# Patient Record
Sex: Female | Born: 1975
Health system: Southern US, Community
[De-identification: ages and names within clinical notes are randomized; demographics above are authoritative.]

## PROBLEM LIST (undated history)

## (undated) DIAGNOSIS — J302 Other seasonal allergic rhinitis: Secondary | ICD-10-CM

## (undated) DIAGNOSIS — Z950 Presence of cardiac pacemaker: Secondary | ICD-10-CM

## (undated) DIAGNOSIS — I10 Essential (primary) hypertension: Secondary | ICD-10-CM

## (undated) DIAGNOSIS — I499 Cardiac arrhythmia, unspecified: Secondary | ICD-10-CM

## (undated) DIAGNOSIS — F419 Anxiety disorder, unspecified: Secondary | ICD-10-CM

## (undated) HISTORY — PX: TUBAL LIGATION: SHX77

## (undated) HISTORY — PX: INSERT / REPLACE / REMOVE PACEMAKER: SUR710

## (undated) HISTORY — PX: CHOLECYSTECTOMY: SHX55

## (undated) HISTORY — PX: BREAST SURGERY: SHX581

## (undated) HISTORY — DX: Cardiac arrhythmia, unspecified: I49.9

## (undated) HISTORY — PX: ABDOMINOPLASTY: SUR9

## (undated) HISTORY — DX: Essential (primary) hypertension: I10

---
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
## 1998-03-15 ENCOUNTER — Encounter (HOSPITAL_COMMUNITY): Admission: RE | Admit: 1998-03-15 | Discharge: 1998-03-17 | Payer: Self-pay | Admitting: Gynecology

## 1998-03-17 ENCOUNTER — Inpatient Hospital Stay (HOSPITAL_COMMUNITY): Admission: AD | Admit: 1998-03-17 | Discharge: 1998-03-20 | Payer: Self-pay | Admitting: Gynecology

## 1998-04-26 ENCOUNTER — Other Ambulatory Visit: Admission: RE | Admit: 1998-04-26 | Discharge: 1998-04-26 | Payer: Self-pay | Admitting: Obstetrics and Gynecology

## 1998-06-15 ENCOUNTER — Ambulatory Visit (HOSPITAL_COMMUNITY): Admission: RE | Admit: 1998-06-15 | Discharge: 1998-06-16 | Payer: Self-pay | Admitting: Cardiovascular Disease

## 1998-07-10 ENCOUNTER — Ambulatory Visit (HOSPITAL_COMMUNITY): Admission: RE | Admit: 1998-07-10 | Discharge: 1998-07-11 | Payer: Self-pay | Admitting: General Surgery

## 1999-10-08 ENCOUNTER — Other Ambulatory Visit: Admission: RE | Admit: 1999-10-08 | Discharge: 1999-10-08 | Payer: Self-pay | Admitting: Gynecology

## 2000-11-06 ENCOUNTER — Inpatient Hospital Stay (HOSPITAL_COMMUNITY): Admission: AD | Admit: 2000-11-06 | Discharge: 2000-11-06 | Payer: Self-pay | Admitting: Gynecology

## 2001-02-16 ENCOUNTER — Other Ambulatory Visit: Admission: RE | Admit: 2001-02-16 | Discharge: 2001-02-16 | Payer: Self-pay | Admitting: Gynecology

## 2001-09-03 ENCOUNTER — Inpatient Hospital Stay (HOSPITAL_COMMUNITY): Admission: AD | Admit: 2001-09-03 | Discharge: 2001-09-05 | Payer: Self-pay | Admitting: Gynecology

## 2001-10-15 ENCOUNTER — Other Ambulatory Visit: Admission: RE | Admit: 2001-10-15 | Discharge: 2001-10-15 | Payer: Self-pay | Admitting: Gynecology

## 2002-12-17 ENCOUNTER — Other Ambulatory Visit: Admission: RE | Admit: 2002-12-17 | Discharge: 2002-12-17 | Payer: Self-pay | Admitting: Gynecology

## 2004-09-19 ENCOUNTER — Other Ambulatory Visit: Admission: RE | Admit: 2004-09-19 | Discharge: 2004-09-19 | Payer: Self-pay | Admitting: Gynecology

## 2005-04-03 ENCOUNTER — Inpatient Hospital Stay (HOSPITAL_COMMUNITY): Admission: AD | Admit: 2005-04-03 | Discharge: 2005-04-07 | Payer: Self-pay | Admitting: Gynecology

## 2005-04-03 ENCOUNTER — Encounter (INDEPENDENT_AMBULATORY_CARE_PROVIDER_SITE_OTHER): Payer: Self-pay | Admitting: Specialist

## 2005-06-19 ENCOUNTER — Other Ambulatory Visit: Admission: RE | Admit: 2005-06-19 | Discharge: 2005-06-19 | Payer: Self-pay | Admitting: Gynecology

## 2006-07-31 ENCOUNTER — Other Ambulatory Visit: Admission: RE | Admit: 2006-07-31 | Discharge: 2006-07-31 | Payer: Self-pay | Admitting: Gynecology

## 2006-08-30 HISTORY — PX: DILATION AND CURETTAGE OF UTERUS: SHX78

## 2006-09-22 ENCOUNTER — Encounter (INDEPENDENT_AMBULATORY_CARE_PROVIDER_SITE_OTHER): Payer: Self-pay | Admitting: *Deleted

## 2006-09-22 ENCOUNTER — Ambulatory Visit (HOSPITAL_COMMUNITY): Admission: RE | Admit: 2006-09-22 | Discharge: 2006-09-22 | Payer: Self-pay | Admitting: Gynecology

## 2008-08-25 ENCOUNTER — Ambulatory Visit (HOSPITAL_COMMUNITY): Admission: RE | Admit: 2008-08-25 | Discharge: 2008-08-25 | Payer: Self-pay | Admitting: *Deleted

## 2010-08-15 ENCOUNTER — Ambulatory Visit: Payer: Self-pay | Admitting: Cardiology

## 2010-08-27 ENCOUNTER — Ambulatory Visit: Payer: Self-pay | Admitting: Cardiology

## 2010-09-05 ENCOUNTER — Ambulatory Visit: Payer: Self-pay | Admitting: Cardiology

## 2010-11-03 ENCOUNTER — Encounter: Payer: Self-pay | Admitting: Internal Medicine

## 2010-11-27 ENCOUNTER — Encounter (INDEPENDENT_AMBULATORY_CARE_PROVIDER_SITE_OTHER): Payer: Self-pay | Admitting: *Deleted

## 2010-12-25 ENCOUNTER — Encounter (INDEPENDENT_AMBULATORY_CARE_PROVIDER_SITE_OTHER): Payer: Self-pay | Admitting: *Deleted

## 2011-01-07 ENCOUNTER — Encounter: Payer: Self-pay | Admitting: Internal Medicine

## 2011-01-31 NOTE — Miscellaneous (Signed)
Summary: Device preload  Clinical Lists Changes  Observations: Added new observation of PPM INDICATN: CHB (11/03/2010 12:31) Added new observation of MAGNET RTE: BOL 85 ERI 65 (11/03/2010 12:31) Added new observation of PPMLEADSTAT2: active (11/03/2010 12:31) Added new observation of PPMLEADSER2: [id number] (11/03/2010 12:31) Added new observation of PPMLEADMOD2: 1236T (11/03/2010 12:31) Added new observation of PPMLEADDOI2: 06/15/1998 (11/03/2010 12:31) Added new observation of PPMLEADLOC2: RV (11/03/2010 12:31) Added new observation of PPMLEADSTAT1: active (11/03/2010 12:31) Added new observation of PPMLEADSER1: [id number] (11/03/2010 12:31) Added new observation of PPMLEADMOD1: 1342T (11/03/2010 12:31) Added new observation of PPMLEADDOI1: 06/15/1998 (11/03/2010 12:31) Added new observation of PPMLEADLOC1: RA (11/03/2010 12:31) Added new observation of PPM DOI: 08/25/2008 (11/03/2010 12:31) Added new observation of PPM SERL#: [id number] (11/03/2010 12:31) Added new observation of PPM MODL#: [id number] (11/03/2010 12:31) Added new observation of PACEMAKERMFG: Medtronic (11/03/2010 12:31) Added new observation of PPM IMP [name]: [name] (11/03/2010 12:31) Added new observation of PPM REFER [name]: [name] (11/03/2010 12:31) Added new observation of PACEMAKER [name]: Alejandra Bunting, [name] (11/03/2010 12:31)      PPM Specifications Following [name]:  Alejandra Bunting, [name]     Referring [name]:  [name] PPM Vendor:  Medtronic     PPM Model Number:  [id number]     PPM Serial Number:  [id number] PPM DOI:  08/25/2008     PPM Implanting [name]:  [name]  Lead 1    Location: RA     DOI: 06/15/1998     Model #: [id number]     Serial #: [id number]     Status: active Lead 2    Location: RV     DOI: 06/15/1998     Model #: 1236T     Serial #: [id number]     Status: active  Magnet Response Rate:  BOL 85 ERI 65  Indications:  CHB

## 2011-01-31 NOTE — Letter (Signed)
Summary: Appointment - Missed  [facility], Main Office  [address]   [place], [place] [address]   Phone: [phone]  Fax: [phone]     November 27, 2010 MRN: [id number]   [facility] [address] [place], [place]  [address]   Dear [name],  Our records indicate you missed your appointment on 11-19-10 with the [facility].  It is very important that we reach you to reschedule this appointment. We look forward to participating in your health care needs. Please contact us at the number listed above at your earliest convenience to reschedule this appointment.     Sincerely,    Glass blower/designer

## 2011-01-31 NOTE — Cardiovascular Report (Signed)
Summary: Certified Letter Signed - Patient (No Response, No Appt)  Certified Letter Signed - Patient (No Response, No Appt)   Imported By: Debby Freiberg 01/17/2011 11:13:16  _____________________________________________________________________  External Attachment:    Type:   Image     Comment:   External Document

## 2011-01-31 NOTE — Letter (Signed)
Summary: Device-Delinquent Check  [facility], Main Office  [address]   [place], [place] [address]   Phone: [phone]  Fax: [phone]     December 25, 2010 MRN: [id number]   [facility] [address] [place], [place]  [address]   Dear [name],  According to our records, you have not had your implanted device checked in the recommended period of time.  We are unable to determine appropriate device function without checking your device on a regular basis.  Please call our office to schedule an appointment as soon as possible.  If you are having your device checked by another physician, please call us so that we may update our records.  Thank you,  Altha Harm, LPN  December 25, 2010 4:28 PM  [facility]  certified mail

## 2011-03-06 ENCOUNTER — Ambulatory Visit: Payer: Self-pay | Admitting: Nurse Practitioner

## 2011-05-02 ENCOUNTER — Encounter: Payer: Self-pay | Admitting: *Deleted

## 2011-05-17 NOTE — H&P (Signed)
[name], [name]               ACCOUNT NO.:  [id number]   MEDICAL RECORD NO.:  [id number]          PATIENT TYPE:  MAT   LOCATION:  MATC                          [facility]   PHYSICIAN:  Timothy P. Fontaine, M.D.DATE OF BIRTH:  [birth year]   DATE OF ADMISSION:  DATE OF DISCHARGE:                                HISTORY & PHYSICAL   DATE OF ADMISSION:  April 03, 2005   CHIEF COMPLAINT:  Pregnancy at 39 weeks, favorable cervix, pacemaker.   HISTORY OF PRESENT ILLNESS:  [name] at 38 weeks with  favorable cervix for induction. The patient's pregnancy has been  uncomplicated. 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 Franchot Heidelberg. 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 For the  remainder of her history, see [name].   PHYSICAL EXAMINATION:  HEENT:  Normal.  LUNGS:  Clear.  CARDIAC:  Alejandra 2/6 systolic ejection murmur felt to be functional by Dr.  Alanda Amass.  ABDOMEN:  Benign, uterus gravid, consistent with term. Positive fetal heart  tone.  PELVIC:  Cervix 50% effaced, 2-Johnston cm dilated, -2 station.   ASSESSMENT AND PLAN:  [name], favorable cervix at 39  weeks, history of conduction defect with pacemaker, for induction. She is  beta strep positive and will plan on antibiotic prophylaxis with initiation  of her Pitocin induction. The plan had been discussed with the patient who  understands and accepts.      TPF/MEDQ  D:  04/02/2005  T:  04/02/2005  Job:  [id number]

## 2011-05-17 NOTE — Op Note (Signed)
[name], [name]               ACCOUNT NO.:  [id number]   MEDICAL RECORD NO.:  [id number]          PATIENT TYPE:  INP   LOCATION:  9168                          [facility]   PHYSICIAN:  Ivor Costa. Farrel Gobble, M.D. DATE OF BIRTH:  01-26-1976   DATE OF PROCEDURE:  04/03/2005  DATE OF DISCHARGE:                                 OPERATIVE REPORT   PREOPERATIVE DIAGNOSIS:  1.  39 week intrauterine pregnancy.  2.  Attempted vaginal birth after cesarean section.  3.  Fetal bradycardia.   POSTOPERATIVE DIAGNOSIS:  1.  39 week intrauterine pregnancy.  2.  Attempted vaginal birth after cesarean section.  3.  Fetal bradycardia.  4.  Complete uterine dehiscence.  5.  Undesired fertility.   PROCEDURE:  STAT repeat cesarean section and a modified bilateral tubal  ligation.   SURGEON:  Ivor Costa. Farrel Gobble, M.D.   ANESTHESIA:  Epidural.   FLUIDS REPLACED:  2 liters lactated ringers.   ESTIMATED BLOOD LOSS:  400 mL.   URINE OUTPUT:  150 mL clear urine.   INDICATIONS FOR PROCEDURE:  After seeing patient 20 minutes before for IUPC  placement status post epidural, she was noted to be 4 cm, came back to the  room prior to leaving the hospital just to check on the patient and found  the patient to be in a deep semi-Fowler's position with bradycardia with the  nurse attempting to get fetal heart tones.  As an FSE had not been placed at  this point, we attempted to place an FSE over the cervix, which was deviated  posteriorly felt to be secondary to the patient's exaggerated position as  the vertex had not been engaged.  After two attempts, we rotated the patient  to her back.  However, the cervix remained posterior and although an FSE was  placed, it was felt that the elevation of the fetal vertex was due to a  possible uterine dehiscence and the patient was transferred STAT to the OR.  Fetal bradycardia in 70's and poor attenuation of IUPC were noted.   FINDINGS:  Complete uterine  dehiscence of fetus and placenta in the  abdominal cavity, birth weight not available, Apgars were 2 at 1 minute, 5  at 5 minutes, and 10 minutes was not available.  Except for the dehiscence,  the uterus, tubes, and ovaries were unremarkable.   COMPLICATIONS:  As above.   PATHOLOGY:  Mid portion of the left and right tube as well as the placenta.   DESCRIPTION OF PROCEDURE:  Without scrubbing, I gowned and gloved, quickly  put Betadine on the patient, placed a Foley catheter, and after anesthesia  was assured, a Pfannenstiel skin incision was made with the scalpel.  The  fascia was scored and extended laterally with the scalpel.  The inferior  aspect of the incision was grasped with Kochers and the underlying rectus  muscles were dissected sharply.  In a similar fashion, the superior aspect  of the incision was grasped with Kochers and the underlying rectus muscles  were sharply dissected off.  The rectus muscles were naturally separated  in  the midline.  The peritoneum was entered bluntly and extended bluntly.  At  this point, blood in the abdomen was noted.  The fetal ear was also seen,  however, the head was not engaged and was unable to be grasped because of  the fluid.  Ultimately, the baby was delivered with the aid of baby  Elliott's from the abdominal cavity.  A slight spontaneous cry was noted.  He was handed off to the awaiting pediatricians.  The cord was noted to be  markedly thin with scant amount of Wharton's jelly.  The placenta was also  in the cavity which made it unable for [place] to obtain cord bloods.  An attempt  at a pH was performed which came back at 6.92 with a base excess of -16.  The infant was handed to the pediatricians who continued in the  resuscitation of the baby.   The uterus was exteriorized, the scar was noted.  It had not been extended  for delivery of the baby, it was noted to be non-bleeding.  The inferior  aspect of the incision was grasped with a  ring and elevated and the bladder  was noted to be inferior to this point, as well.  The uterine incision was  then repaired with a running locked layer of 0 chromic and a second suture  of chromic was used to imbricate.  It was noted to be hemostatic afterwards.  As the patient had conceived status post a vasectomy she consented to a  tubal  ligation in the event of a C-section.  The tubes and ovaries were  inspected and noted to be unremarkable.  The mid portion of the right tube  was elevated, two free ties of 3-0 plain were placed and the mid portion was  excised.  In a similar fashion, the left tube was treated.  They were both  noted to be hemostatic.  The pelvis was irrigated.  The uterus was returned  back to the abdomen.  It was noted to be hemostatic.  The abdomen was  cleared of all clots and debris.  I inspected the omentum as well as the  bowel and they were noted to be unremarkable.  The inspection of the  bladder, fascia, peritoneum, and muscle also were noted to be unremarkable  and hemostatic.  The fascial incision was closed with a running locked layer  of 0 vircyl and noted to be hemostatic, the subcu was irrigated, and the  skin was closed with staples.  The patient received penicillin  preoperatively and gentamicin interoperatively because of placement of a  pacemaker.      THL/MEDQ  D:  04/03/2005  T:  04/03/2005  Job:  [id number]

## 2011-05-17 NOTE — Discharge Summary (Signed)
[facility]  Patient:    [name], [name] Visit Number: [id number] MRN: [id number]          Service Type: OBS Location: 910A 9117 01 Attending Physician:  Merrily Pew Dictated by:   Antony Contras, N.P. Admit Date:  09/03/2001 Discharge Date: 09/05/2001                             Discharge Summary  DISCHARGE DIAGNOSES:          Intrauterine pregnancy at 38 weeks with spontaneous onset of labor.  HISTORY OF PRESENT ILLNESS:   Patient is a 35 year old gravida 5, para 2-0-2-2 with an LMP December 08, 2000, [facility] September 14, 2001.  Prenatal risk factors include a history of previous cesarean section, heart block, recurrent UTI, and also patient is Rh negative.  PRENATAL LABORATORIES:        Blood type O-.  Antibody screen negative.  RPR, HBSAG, HIV nonreactive.  Rubella immune.  GBS positive.  HOSPITAL COURSE:              Patient was admitted on September 03, 2001 with spontaneous onset of labor.  Cervix was 2 cm, 50%, -3 station.  Patient was also started on penicillin G for positive GBS.  She progressed to complete dilatation and delivered an Apgar 9/9 female infant over an intact perineum. Delivery was precipitous.  The infant weighed 6 pounds 12 ounces.  Postpartum course patient remained afebrile.  Was able to be discharged on her second postpartum day in satisfactory condition.  LABORATORIES:                 CBC:  Hematocrit 31.1, hemoglobin 11.1, WBC 9.8, platelets 152,000.  Baby was Rh positive so she did receive RhoGAM.  DISPOSITION:                  Follow-up in six weeks.  Continue prenatal vitamins and iron, Motrin and Tylox for pain. Dictated by:   Antony Contras, N.P. Attending Physician:  Merrily Pew DD:  09/25/01 TD:  09/25/01 Job: [id number]

## 2011-05-17 NOTE — Discharge Summary (Signed)
NAMEARNETT, GALINDEZ               ACCOUNT NO.:  [id number]   MEDICAL RECORD NO.:  [id number]          PATIENT TYPE:  INP   LOCATION:  9315                          [facility]   PHYSICIAN:  Juan H. Lily Peer, M.D.DATE OF BIRTH:  05/18/1976   DATE OF ADMISSION:  04/03/2005  DATE OF DISCHARGE:  04/07/2005                                 DISCHARGE SUMMARY   HISTORY:  The patient is a 35 year old gravida 5, para 3, now para 4 who was  admitted on April 5 with a favorable cervix for induction.  The patient with  history, first pregnancy resulting in a vaginal delivery, second pregnancy  cesarean section secondary to breech, and third pregnancy successful vaginal  birth after C-section.  The patient wished for another trial of labor,  developed fetal bradycardia, was taken to the operating room where there was  evidence of complete uterine descensus and the patient also had requested as  well tubal ligation prenatally for which she had been counseled.  The  patient underwent an emergency repeat lower uterine segment transverse  cesarean section with modified bilateral tubal sterilization procedure by  Dr. Douglass Rivers on April 03, 2005, the patient delivered a viable female  infant, Apgars of 5 and 5.  The patient postoperatively did well, had Foley  catheter for 24 hours which was then removed and the patient was advanced  from a clear liquid to a full regular diet.  Her  postoperative hemoglobin  and hematocrit had been 8.1 and 27.0 respectively.  The platelet count was  347,000.  The patient also did receive RhoGAM due to the fact that the baby  was Rh positive and the patient was ready to be discharged home on the  fourth postoperative day.   FINAL DIAGNOSES:  1.  39 weeks' estimated gestational age, delivered via cesarean section for      fetal bradycardia.  Findings intraoperatively of uterine dehiscence.      Delivered a viable female infant.  2.  The patient requested repeat  cesarean section.  3.  RhoGAM candidate.  4.  Postpartum anemia.  5.  The patient was requesting elective permanent sterilization.   PROCEDURE:  1.  Repeat lower uterine segment transverse cesarean section.  2.  Bilateral tubal sterilization procedure.  3.  RhoGAM administration.   FINAL DISPOSITION:  The patient was discharged home on her fourth  postoperative day.  Her staples were removed.  Her incision was intact.  She  was passing flatus, was ambulating, showering, tolerating a regular diet  well, and was ready to be discharged home.  She was given a prescription for  prenatal vitamins to take one p.o. daily.  Also iron supplementation one  p.o. daily and a prescription for Lortab 7.5/500 to take one p.o. q.4-6h.  p.r.n. pain.  GGA discharge instruction sheet was provided and the patient  to follow up for 6-week postpartum visit.     JHF/MEDQ  D:  04/07/2005  T:  04/07/2005  Job:  [id number]

## 2011-05-17 NOTE — Op Note (Signed)
[name], [name]               ACCOUNT NO.:  [id number]   MEDICAL RECORD NO.:  [id number]          PATIENT TYPE:  AMB   LOCATION:  SDC                           [facility]   PHYSICIAN:  Ivor Costa. Farrel Gobble, M.D. DATE OF BIRTH:  31-Jan-1976   DATE OF PROCEDURE:  09/22/2006  DATE OF DISCHARGE:                                 OPERATIVE REPORT   PREOPERATIVE DIAGNOSIS:  Menorrhagia.   POSTOPERATIVE DIAGNOSIS:  Menorrhagia.   PROCEDURE:  D&C hysteroscopy with NovaSure endometrial ablation.   SURGEON:  Ivor Costa. Farrel Gobble, M.D.   ANESTHESIA:  MAC with a paracervical block of 10 mL of quarter percent  Marcaine.   I&O deficit of lactated Ringer's was 40 mL.   ESTIMATED BLOOD LOSS:  Minimal.   FINDINGS:  The uterus was anteverted with normal smooth contours.  Ostia  were visualized.  The uterus sounded to 9.  Cervix was 3 cm, and the width  was 3.5.   COMPLICATIONS:  None.   PATHOLOGY:  Endometrial curettings.   PROCEDURE:  The patient was taken to the operating room.  General anesthesia  was induced, placed in the dorsal lithotomy position, prepped and draped in  usual sterile fashion.  A bimanual exam was performed.  Bivalve speculum was  placed in vagina.  Cervix was visualized, stabilized with a single-tooth  tenaculum and paracervical block was placed.  The cervical length was then  measured with a #7 and #8 Hagar dilator.  The orientation of the canal was  confirmed, and the length was also confirmed.  The cervix required dilation  to 21-French.  The diagnostic hysteroscope was then advanced through the  cervix towards the fundus.  The ostia were visualized.  The cavity was  smooth.  Endometrial curetting was performed, and tissues were then sent for  pathology.  The cervix required dilation up to 21-French in order to allow  passage of the NovaSure which was set at the indications above.  The seeding  was done with the usual maneuvers.  The cavity width was confirmed at  3.5.  The cervix was grasped anterior and posterior.  Because of the slight  dilation, a puff test of CO2 was passed.  The ablation then occurred with  power of 116 at a minute and 50 seconds.  The NovaSure was removed.  The  hysteroscope was then advanced  back through the cavity.  A good ablative technique was seen, and the  treated cervical portion was also seen clearly.  The patient tolerated the  procedure well.  Sponge, lap and needle counts were correct.  She was  transferred to the PACU in stable condition.      Ivor Costa. Farrel Gobble, M.D.  Electronically Signed     THL/MEDQ  D:  09/22/2006  T:  09/24/2006  Job:  [id number]

## 2011-05-17 NOTE — H&P (Signed)
[facility]  Patient:    [name], [name] Visit Number: [id number] MRN: [id number]          Service Type: OBS Location: 910B 9156 01 Attending Physician:  Merrily Pew Dictated by:   Nadyne Coombes Fontaine, M.D. Admit Date:  09/03/2001                           History and Physical  CHIEF COMPLAINT:              1. Labor.                               2. Pregnancy at term.  HISTORY OF PRESENT ILLNESS:   This is a 35 year old, G5, P2, Ab2, female at [redacted] weeks gestation who enters with regular uterine contractions every three minutes. The patient had episodes of nausea and vomiting in the evening and subsequently developed contractions. The patient received IV hydration in triage without resolution of her contractions, in fact, noting that they are getting stronger, and at the time of admission, she is contracting every three to five minutes. No history of rupture of membranes or bloody show.  PRENATAL COURSE:              Significant for history of prior cesarean section for breech presentation who desires VBAC after appropriate counseling. She also has a history of heart block for which she has a pacemaker with no reported recent problems. The patient is Rh negative and is strep positive on screening.  PAST MEDICAL HISTORY:         Significant for her heart block with pacemaker.  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  MEDICATIONS:                  None.  ALLERGIES:                    None.  REVIEW OF SYSTEMS:            Noncontributory.  SOCIAL HISTORY:               Noncontributory.  ADMISSION PHYSICAL EXAMINATION:  VITAL SIGNS:                  Afebrile. Vital signs are stable.  HEENT:                        Normal.  LUNGS:                        Clear.  CARDIAC:                      Regular rate. No murmurs, rubs, or  gallops.  ABDOMEN:                      Gravid, vertex.  ELECTRONIC FETAL MONITORING:  External monitor shows reactive fetus and contractions every three to five minutes.  PELVIC:                       2 cm, 50%, -3 station, vertex presentation.  ASSESSMENT:  1. Term pregnancy.                               2. Early labor for planned vaginal birth after                                  cesarean section.                               3. Streptococcus positive.  PLAN:                         Will plan antibiotic prophylaxis. Admission and artificial rupture of membranes. Plan was discussed with patient and her husband who understand and accept. Dictated by:   Nadyne Coombes. Fontaine, M.D. Attending Physician:  Merrily Pew DD:  09/03/01 TD:  09/03/01 Job: [id number]

## 2011-12-03 ENCOUNTER — Other Ambulatory Visit: Payer: Self-pay | Admitting: Cardiology

## 2014-04-25 NOTE — H&P (Addendum)
[name] is an 38 y.o. femalewith menorrhagia presents for surgical mngt.  Continues to have dysmenorrhea and menorrhagia despite endometrial ablation  Pertinent Gynecological History: Menses: heavy with cramping Bleeding: dysfunctional uterine bleeding Contraception: tubal ligation DES exposure: denies Blood transfusions: none Sexually transmitted diseases: no past history Previous GYN Procedures: none  Last pap: normal Date: 7/14 OB History: G5, P4   Menstrual History: No LMP recorded. 04/04/14    Past Medical History  Diagnosis Date  . Hypertension   . Arrhythmia     complete heart block    Past Surgical History  Procedure Laterality Date  . Cardiac catheterization    . Insert / replace / remove pacemaker  2009  c-section x 2 SVD x 2 Breast augmentation Cholecystectomy Endometrial ablation  Family History  Problem Relation Age of Onset  . Hypertension Mother     Social History:  reports that she has never smoked. She does not have any smokeless tobacco history on file. Her alcohol and drug histories are not on file.  Allergies: No Known Allergies  No prescriptions prior to admission   Meds:  Diovan, celexa ROS  Physical Exam AF, VSS  BP 112/74 Gen - NAD CV - RRR Lungs - clear Abd - soft, NT/ND PV - uterus mobile, NT Ext - nt, no edema   Assessment/Plan: Menorrhagia/Dysmenorrhea LAVH R/b/a discussed, questions answered, informed consent   Marylynn Pearson 04/25/2014, 11:39 AM

## 2014-04-27 ENCOUNTER — Encounter (HOSPITAL_COMMUNITY): Payer: Self-pay

## 2014-05-02 NOTE — Patient Instructions (Addendum)
   Your procedure is scheduled on:  Thursday, May 7  Enter through the Main Entrance of [facility] at: 6 AM Pick up the phone at the desk and dial [phone] and inform us of your arrival.  Please call this number if you have any problems the morning of surgery: [phone]  Remember: Do not eat or drink after midnight: Wednesday Take these medicines the morning of surgery with a SIP OF WATER: celexa, valsartan/hctz, claritin  Do not wear jewelry, make-up, or FINGER nail polish No metal in your hair or on your body. Do not wear lotions, powders, perfumes.  You may wear deodorant.  Do not bring valuables to the hospital. Contacts, dentures or bridgework may not be worn into surgery.  Leave suitcase in the car. After Surgery it may be brought to your room. For patients being admitted to the hospital, checkout time is 11:00am the day of discharge.  Home with [name] [name] cell [phone].

## 2014-05-03 ENCOUNTER — Encounter (HOSPITAL_COMMUNITY)
Admission: RE | Admit: 2014-05-03 | Discharge: 2014-05-03 | Disposition: A | Discharge status: Home / Self Care | Payer: BC Managed Care – PPO | Source: Ambulatory Visit | Attending: Obstetrics and Gynecology | Admitting: Obstetrics and Gynecology

## 2014-05-03 ENCOUNTER — Encounter (HOSPITAL_COMMUNITY): Payer: Self-pay

## 2014-05-03 HISTORY — DX: Other seasonal allergic rhinitis: J30.2

## 2014-05-03 HISTORY — DX: Anxiety disorder, unspecified: F41.9

## 2014-05-03 LAB — ABO/RH: ABO/RH(D): O NEG

## 2014-05-03 LAB — COMPREHENSIVE METABOLIC PANEL
ALK PHOS: 81 U/L (ref 39–117)
ALT: 45 U/L — AB (ref 0–35)
AST: 34 U/L (ref 0–37)
Albumin: 3.6 g/dL (ref 3.5–5.2)
BUN: 15 mg/dL (ref 6–23)
CO2: 26 meq/L (ref 19–32)
Calcium: 9.7 mg/dL (ref 8.4–10.5)
Chloride: 99 mEq/L (ref 96–112)
Creatinine, Ser: 0.72 mg/dL (ref 0.50–1.10)
GFR calc Af Amer: >90 mL/min (ref >90)
GFR calc non Af Amer: >90 mL/min (ref >90)
Glucose, Bld: 99 mg/dL (ref 70–99)
POTASSIUM: 4.2 meq/L (ref 3.7–5.3)
SODIUM: 138 meq/L (ref 137–147)
TOTAL PROTEIN: 6.7 g/dL (ref 6.0–8.3)
Total Bilirubin: 0.3 mg/dL (ref 0.3–1.2)

## 2014-05-03 LAB — TYPE AND SCREEN
ABO/RH(D): O NEG
Antibody Screen: NEGATIVE

## 2014-05-03 LAB — CBC
HCT: 39.2 % (ref 36.0–46.0)
Hemoglobin: 13.6 g/dL (ref 12.0–15.0)
MCH: 31.4 pg (ref 26.0–34.0)
MCHC: 34.7 g/dL (ref 30.0–36.0)
MCV: 90.5 fL (ref 78.0–100.0)
PLATELETS: 264 10*3/uL (ref 150–400)
RBC: 4.33 MIL/uL (ref 3.87–5.11)
RDW: 11.9 % (ref 11.5–15.5)
WBC: 5.4 10*3/uL (ref 4.0–10.5)

## 2014-05-04 MED ORDER — DEXTROSE 5 % IV SOLN
2.0000 g | INTRAVENOUS | Status: AC
  Administered 2014-05-05: 2 g via INTRAVENOUS
  Filled 2014-05-04: qty 2

## 2014-05-05 ENCOUNTER — Encounter (HOSPITAL_COMMUNITY): Payer: BC Managed Care – PPO | Admitting: Anesthesiology

## 2014-05-05 ENCOUNTER — Ambulatory Visit (HOSPITAL_COMMUNITY): Payer: BC Managed Care – PPO | Admitting: Anesthesiology

## 2014-05-05 ENCOUNTER — Encounter (HOSPITAL_COMMUNITY)
Admission: RE | Disposition: A | Discharge status: Home / Self Care | Payer: Self-pay | Source: Ambulatory Visit | Attending: Obstetrics and Gynecology

## 2014-05-05 ENCOUNTER — Encounter (HOSPITAL_COMMUNITY): Payer: Self-pay | Admitting: *Deleted

## 2014-05-05 ENCOUNTER — Observation Stay (HOSPITAL_COMMUNITY)
Admission: RE | Admit: 2014-05-05 | Discharge: 2014-05-06 | Disposition: A | Discharge status: Home / Self Care | Payer: BC Managed Care – PPO | Source: Ambulatory Visit | Attending: Obstetrics and Gynecology | Admitting: Obstetrics and Gynecology

## 2014-05-05 DIAGNOSIS — Z950 Presence of cardiac pacemaker: Secondary | ICD-10-CM | POA: Insufficient documentation

## 2014-05-05 DIAGNOSIS — I442 Atrioventricular block, complete: Secondary | ICD-10-CM | POA: Insufficient documentation

## 2014-05-05 DIAGNOSIS — I10 Essential (primary) hypertension: Secondary | ICD-10-CM | POA: Insufficient documentation

## 2014-05-05 DIAGNOSIS — N920 Excessive and frequent menstruation with regular cycle: Principal | ICD-10-CM | POA: Insufficient documentation

## 2014-05-05 DIAGNOSIS — N946 Dysmenorrhea, unspecified: Secondary | ICD-10-CM | POA: Insufficient documentation

## 2014-05-05 HISTORY — PX: LAPAROSCOPIC ASSISTED VAGINAL HYSTERECTOMY: SHX5398

## 2014-05-05 SURGERY — HYSTERECTOMY, VAGINAL, LAPAROSCOPY-ASSISTED
Anesthesia: General | Site: Abdomen

## 2014-05-05 MED ORDER — ONDANSETRON HCL 4 MG/2ML IJ SOLN
INTRAMUSCULAR | Status: DC | PRN
  Administered 2014-05-05: 4 mg via INTRAVENOUS

## 2014-05-05 MED ORDER — MEPERIDINE HCL 25 MG/ML IJ SOLN: 6.2500 mg | INTRAMUSCULAR | Status: DC | PRN

## 2014-05-05 MED ORDER — KETOROLAC TROMETHAMINE 30 MG/ML IJ SOLN
INTRAMUSCULAR | Status: DC | PRN
  Administered 2014-05-05: 30 mg via INTRAVENOUS

## 2014-05-05 MED ORDER — HYDROMORPHONE HCL PF 1 MG/ML IJ SOLN
0.2500 mg | INTRAMUSCULAR | Status: DC | PRN
Start: 2014-05-05 — End: 2014-05-05
  Administered 2014-05-05 (×4): 0.5 mg via INTRAVENOUS

## 2014-05-05 MED ORDER — MIDAZOLAM HCL 2 MG/2ML IJ SOLN
INTRAMUSCULAR | Status: DC | PRN
  Administered 2014-05-05: 2 mg via INTRAVENOUS

## 2014-05-05 MED ORDER — HYDROCHLOROTHIAZIDE 25 MG PO TABS
25.0000 mg | ORAL_TABLET | Freq: Every day | ORAL | Status: DC
  Administered 2014-05-06: 25 mg via ORAL
  Filled 2014-05-05: qty 1

## 2014-05-05 MED ORDER — DEXTROSE IN LACTATED RINGERS 5 % IV SOLN
INTRAVENOUS | Status: DC
  Administered 2014-05-05: 12:00:00 via INTRAVENOUS

## 2014-05-05 MED ORDER — CEFAZOLIN SODIUM-DEXTROSE 2-3 GM-% IV SOLR
INTRAVENOUS | Status: AC
  Filled 2014-05-05: qty 50

## 2014-05-05 MED ORDER — FENTANYL CITRATE 0.05 MG/ML IJ SOLN
INTRAMUSCULAR | Status: DC | PRN
  Administered 2014-05-05: 50 ug via INTRAVENOUS
  Administered 2014-05-05 (×2): 100 ug via INTRAVENOUS

## 2014-05-05 MED ORDER — IBUPROFEN 600 MG PO TABS
600.0000 mg | ORAL_TABLET | Freq: Four times a day (QID) | ORAL | Status: DC | PRN
  Administered 2014-05-06: 600 mg via ORAL
  Filled 2014-05-05: qty 1

## 2014-05-05 MED ORDER — GLYCOPYRROLATE 0.2 MG/ML IJ SOLN
INTRAMUSCULAR | Status: DC | PRN
  Administered 2014-05-05: 0.4 mg via INTRAVENOUS

## 2014-05-05 MED ORDER — EPHEDRINE SULFATE 50 MG/ML IJ SOLN
INTRAMUSCULAR | Status: DC | PRN
  Administered 2014-05-05: 10 mg via INTRAVENOUS

## 2014-05-05 MED ORDER — NEOSTIGMINE METHYLSULFATE 10 MG/10ML IV SOLN
INTRAVENOUS | Status: DC | PRN
  Administered 2014-05-05: 2.5 mg via INTRAVENOUS

## 2014-05-05 MED ORDER — FENTANYL CITRATE 0.05 MG/ML IJ SOLN
INTRAMUSCULAR | Status: AC
  Filled 2014-05-05: qty 5

## 2014-05-05 MED ORDER — DEXAMETHASONE SODIUM PHOSPHATE 10 MG/ML IJ SOLN
INTRAMUSCULAR | Status: DC | PRN
  Administered 2014-05-05: 6 mg via INTRAVENOUS

## 2014-05-05 MED ORDER — BUPIVACAINE HCL (PF) 0.25 % IJ SOLN
INTRAMUSCULAR | Status: DC | PRN
  Administered 2014-05-05: 4 mL

## 2014-05-05 MED ORDER — OXYCODONE-ACETAMINOPHEN 5-325 MG PO TABS
1.0000 | ORAL_TABLET | ORAL | Status: DC | PRN
  Administered 2014-05-05 – 2014-05-06 (×4): 2 via ORAL
  Filled 2014-05-05 (×3): qty 2
  Filled 2014-05-05 (×2): qty 1

## 2014-05-05 MED ORDER — KETOROLAC TROMETHAMINE 30 MG/ML IJ SOLN
30.0000 mg | Freq: Three times a day (TID) | INTRAMUSCULAR | Status: DC
  Administered 2014-05-05 (×2): 30 mg via INTRAVENOUS
  Filled 2014-05-05 (×2): qty 1

## 2014-05-05 MED ORDER — HYDROMORPHONE HCL PF 1 MG/ML IJ SOLN
INTRAMUSCULAR | Status: AC
  Filled 2014-05-05: qty 1

## 2014-05-05 MED ORDER — MENTHOL 3 MG MT LOZG
1.0000 | LOZENGE | OROMUCOSAL | Status: DC | PRN
Start: 2014-05-05 — End: 2014-05-06

## 2014-05-05 MED ORDER — BUPIVACAINE HCL (PF) 0.25 % IJ SOLN
INTRAMUSCULAR | Status: AC
  Filled 2014-05-05: qty 30

## 2014-05-05 MED ORDER — EPHEDRINE 5 MG/ML INJ
INTRAVENOUS | Status: AC
  Filled 2014-05-05: qty 10

## 2014-05-05 MED ORDER — PROMETHAZINE HCL 25 MG/ML IJ SOLN
6.2500 mg | INTRAMUSCULAR | Status: DC | PRN
Start: 2014-05-05 — End: 2014-05-05

## 2014-05-05 MED ORDER — LACTATED RINGERS IV SOLN: INTRAVENOUS | Status: DC

## 2014-05-05 MED ORDER — 0.9 % SODIUM CHLORIDE (POUR BTL) OPTIME
TOPICAL | Status: DC | PRN
  Administered 2014-05-05: 1000 mL

## 2014-05-05 MED ORDER — CITALOPRAM HYDROBROMIDE 20 MG PO TABS
20.0000 mg | ORAL_TABLET | Freq: Every day | ORAL | Status: DC
  Administered 2014-05-06: 20 mg via ORAL
  Filled 2014-05-05: qty 1

## 2014-05-05 MED ORDER — ONDANSETRON HCL 4 MG/2ML IJ SOLN: 4.0000 mg | Freq: Four times a day (QID) | INTRAMUSCULAR | Status: DC | PRN

## 2014-05-05 MED ORDER — ROCURONIUM BROMIDE 100 MG/10ML IV SOLN
INTRAVENOUS | Status: DC | PRN
  Administered 2014-05-05: 50 mg via INTRAVENOUS

## 2014-05-05 MED ORDER — SCOPOLAMINE 1 MG/3DAYS TD PT72
MEDICATED_PATCH | TRANSDERMAL | Status: AC
  Administered 2014-05-05: 1.5 mg
  Filled 2014-05-05: qty 1

## 2014-05-05 MED ORDER — ONDANSETRON HCL 4 MG PO TABS: 4.0000 mg | ORAL_TABLET | Freq: Four times a day (QID) | ORAL | Status: DC | PRN

## 2014-05-05 MED ORDER — HYDROMORPHONE HCL PF 1 MG/ML IJ SOLN
1.0000 mg | INTRAMUSCULAR | Status: DC | PRN
  Administered 2014-05-05 (×3): 1 mg via INTRAVENOUS
  Filled 2014-05-05 (×3): qty 1

## 2014-05-05 MED ORDER — DEXAMETHASONE SODIUM PHOSPHATE 10 MG/ML IJ SOLN
INTRAMUSCULAR | Status: AC
  Filled 2014-05-05: qty 1

## 2014-05-05 MED ORDER — MIDAZOLAM HCL 2 MG/2ML IJ SOLN
INTRAMUSCULAR | Status: AC
  Filled 2014-05-05: qty 2

## 2014-05-05 MED ORDER — LIDOCAINE HCL (CARDIAC) 20 MG/ML IV SOLN
INTRAVENOUS | Status: DC | PRN
  Administered 2014-05-05: 50 mg via INTRAVENOUS

## 2014-05-05 MED ORDER — LACTATED RINGERS IV SOLN
INTRAVENOUS | Status: DC
  Administered 2014-05-05 (×3): via INTRAVENOUS

## 2014-05-05 MED ORDER — PROPOFOL 10 MG/ML IV EMUL
INTRAVENOUS | Status: AC
  Filled 2014-05-05: qty 20

## 2014-05-05 MED ORDER — ONDANSETRON HCL 4 MG/2ML IJ SOLN
INTRAMUSCULAR | Status: AC
  Filled 2014-05-05: qty 2

## 2014-05-05 MED ORDER — SCOPOLAMINE 1 MG/3DAYS TD PT72: 1.0000 | MEDICATED_PATCH | TRANSDERMAL | Status: DC

## 2014-05-05 MED ORDER — LIDOCAINE HCL (CARDIAC) 20 MG/ML IV SOLN
INTRAVENOUS | Status: AC
  Filled 2014-05-05: qty 5

## 2014-05-05 MED ORDER — KETOROLAC TROMETHAMINE 30 MG/ML IJ SOLN
INTRAMUSCULAR | Status: AC
  Filled 2014-05-05: qty 1

## 2014-05-05 MED ORDER — IRBESARTAN 150 MG PO TABS
150.0000 mg | ORAL_TABLET | Freq: Every day | ORAL | Status: DC
  Administered 2014-05-06: 150 mg via ORAL
  Filled 2014-05-05: qty 1

## 2014-05-05 MED ORDER — PROPOFOL 10 MG/ML IV BOLUS
INTRAVENOUS | Status: DC | PRN
  Administered 2014-05-05: 200 mg via INTRAVENOUS

## 2014-05-05 MED ORDER — ROCURONIUM BROMIDE 100 MG/10ML IV SOLN
INTRAVENOUS | Status: AC
  Filled 2014-05-05: qty 1

## 2014-05-05 MED ORDER — HYDROMORPHONE HCL PF 1 MG/ML IJ SOLN
INTRAMUSCULAR | Status: DC | PRN
  Administered 2014-05-05: 1 mg via INTRAVENOUS

## 2014-05-05 MED ORDER — VALSARTAN-HYDROCHLOROTHIAZIDE 160-25 MG PO TABS: 1.0000 | ORAL_TABLET | Freq: Every day | ORAL | Status: DC

## 2014-05-05 SURGICAL SUPPLY — 38 items
CABLE HIGH FREQUENCY MONO STRZ (ELECTRODE) IMPLANT
CANISTER SUCT 3000ML (MISCELLANEOUS) ×2 IMPLANT
CHLORAPREP W/TINT 26ML (MISCELLANEOUS) ×2 IMPLANT
CLOTH BEACON ORANGE TIMEOUT ST (SAFETY) ×2 IMPLANT
COVER TABLE BACK 60X90 (DRAPES) ×2 IMPLANT
DECANTER SPIKE VIAL GLASS SM (MISCELLANEOUS) ×2 IMPLANT
DERMABOND ADVANCED (GAUZE/BANDAGES/DRESSINGS) ×1
DERMABOND ADVANCED .7 DNX12 (GAUZE/BANDAGES/DRESSINGS) ×1 IMPLANT
ELECT LIGASURE LONG (ELECTRODE) IMPLANT
ELECT LIGASURE SHORT 9 REUSE (ELECTRODE) IMPLANT
ELECT REM PT RETURN 9FT ADLT (ELECTROSURGICAL) ×2
ELECTRODE REM PT RTRN 9FT ADLT (ELECTROSURGICAL) ×1 IMPLANT
GLOVE BIO SURGEON STRL SZ 6.5 (GLOVE) ×2 IMPLANT
GLOVE BIOGEL PI IND STRL 6.5 (GLOVE) ×2 IMPLANT
GLOVE BIOGEL PI IND STRL 7.0 (GLOVE) ×1 IMPLANT
GLOVE BIOGEL PI INDICATOR 6.5 (GLOVE) ×2
GLOVE BIOGEL PI INDICATOR 7.0 (GLOVE) ×1
GOWN STRL REUS W/TWL LRG LVL3 (GOWN DISPOSABLE) ×10 IMPLANT
NS IRRIG 1000ML POUR BTL (IV SOLUTION) ×2 IMPLANT
PACK LAVH (CUSTOM PROCEDURE TRAY) ×2 IMPLANT
PROTECTOR NERVE ULNAR (MISCELLANEOUS) ×2 IMPLANT
SEALER TISSUE G2 CVD JAW 45CM (ENDOMECHANICALS) ×2 IMPLANT
SET IRRIG TUBING LAPAROSCOPIC (IRRIGATION / IRRIGATOR) ×2 IMPLANT
SUT MNCRL 0 MO-4 VIOLET 18 CR (SUTURE) ×3 IMPLANT
SUT MNCRL 0 VIOLET 6X18 (SUTURE) ×1 IMPLANT
SUT MON AB 2-0 CT1 36 (SUTURE) ×2 IMPLANT
SUT MONOCRYL 0 6X18 (SUTURE) ×1
SUT MONOCRYL 0 MO 4 18  CR/8 (SUTURE) ×3
SUT VIC AB 3-0 PS2 18 (SUTURE) ×1
SUT VIC AB 3-0 PS2 18XBRD (SUTURE) ×1 IMPLANT
SUT VICRYL 0 UR6 27IN ABS (SUTURE) ×2 IMPLANT
TOWEL OR 17X24 6PK STRL BLUE (TOWEL DISPOSABLE) ×4 IMPLANT
TRAY FOLEY CATH 14FR (SET/KITS/TRAYS/PACK) ×2 IMPLANT
TROCAR OPTI TIP 5M 100M (ENDOMECHANICALS) ×2 IMPLANT
TROCAR XCEL NON-BLD 11X100MML (ENDOMECHANICALS) ×2 IMPLANT
TROCAR XCEL OPT SLVE 5M 100M (ENDOMECHANICALS) IMPLANT
WARMER LAPAROSCOPE (MISCELLANEOUS) ×2 IMPLANT
WATER STERILE IRR 1000ML POUR (IV SOLUTION) IMPLANT

## 2014-05-05 NOTE — Anesthesia Procedure Notes (Signed)
Procedure Name: Intubation Date/Time: 05/05/2014 7:37 AM Performed by: Jonna Munro Pre-anesthesia Checklist: Patient identified, Emergency Drugs available, Suction available, Patient being monitored and Timeout performed Patient Re-evaluated:Patient Re-evaluated prior to inductionOxygen Delivery Method: Circle system utilized Preoxygenation: Pre-oxygenation with 100% oxygen Intubation Type: IV induction Ventilation: Mask ventilation without difficulty Laryngoscope Size: Mac and 3 Grade View: Grade I Tube type: Oral Tube size: 7.0 mm Number of attempts: 1 Airway Equipment and Method: Stylet Secured at: 20 cm Tube secured with: Tape Dental Injury: Teeth and Oropharynx as per pre-operative assessment

## 2014-05-05 NOTE — Addendum Note (Signed)
Addendum created 05/05/14 1338 by Georgeanne Nim, CRNA   Modules edited: Notes Section   Notes Section:  File: [id number]; File: [id number]; File: [id number]

## 2014-05-05 NOTE — Progress Notes (Signed)
Day of Surgery Procedure(s) (LRB): LAPAROSCOPIC ASSISTED VAGINAL HYSTERECTOMY (N/A)  Subjective: Patient reports incisional pain. Pain controlled with iv meds.  No n/v, CP or SOB.  Foley catheter is uncomfortable for her   Objective: I have reviewed patient's vital signs, intake and output and medications.  General: alert and cooperative GI: normal findings: soft, non-tender Extremities: extremities normal, atraumatic, no cyanosis or edema  Assessment: s/p Procedure(s): LAPAROSCOPIC ASSISTED VAGINAL HYSTERECTOMY (N/A): stable  Plan: continue routine post op care  LOS: 0 days    [name] 05/05/2014, 1:46 PM

## 2014-05-05 NOTE — Op Note (Signed)
[name], [name]               ACCOUNT NO.:  [id number]  MEDICAL RECORD NO.:  [id number]  LOCATION:  [facility]                          FACILITY:  [facility]  PHYSICIAN:  Marylynn Pearson, MD    DATE OF BIRTH:  Oct 27, 1976  DATE OF PROCEDURE:  05/05/2014 DATE OF DISCHARGE:                              OPERATIVE REPORT   PREOPERATIVE DIAGNOSES:  Dysmenorrhea and menorrhagia.  POSTOPERATIVE DIAGNOSES:  Dysmenorrhea and menorrhagia.  PROCEDURE:  Laparoscopic-assisted vaginal hysterectomy.  SURGEON:  Marylynn Pearson, MD.  ASSISTANT:  Ralene Bathe. Matthew Saras, M.D.  BLOOD LOSS:  300 mL.  SPECIMEN:  Uterine with cervix.  COMPLICATIONS:  None.  CONDITION:  Stable to recovery room.  URINE OUTPUT:  Clear.  PROCEDURE:  The patient was taken to the operating room.  After informed consent was obtained, she was given general anesthesia, placed in a dorsal lithotomy position using Allen stirrups.  She was prepped and draped in sterile fashion and a Foley catheter was inserted.  Bivalve speculum was placed in the vagina and a single-tooth tenaculum attached to the anterior lip of the cervix.  Hulka clamp was placed.  Tenaculum and speculum were removed and our attention was turned to the abdomen.  Marcaine 0.25% was used to provide local anesthesia at the site of our infraumbilical and suprapubic incisions.  Infraumbilical skin incision was made with a scalpel and extended bluntly to the level of the fascia using a Kelly clamp.  Optical trocar was inserted under direct visualization.  Once intraperitoneal placement was confirmed, CO2 was turned on.  Uterus, bilateral tubes, and ovaries appeared normal.  She did have evidence of previous tubal ligation.  Skin incision was made in the suprapubic region and 5-mm trocar was inserted under direct visualization.  The uterus was manipulated to the patient's left.  The right adnexa was grasped with atraumatic graspers, tented upwards.  The right fallopian  tube and utero-ovarian ligament were grasped, cauterized, and cut using the EnSeal.  This was extended down the mesosalpinx to the level of the round ligament.  The round ligament was grasped, cauterized, and cut, and this was extended down the broad ligament staying just adjacent to the uterus.  Excellent hemostasis was assured and our attention was turned to the left side.  The procedure was repeated on the left.  Once hemostasis was noted bilaterally, all instruments were removed from the abdomen.  Our attention was turned to the vagina.  Hulka clamp was removed from the cervix.  A weighted speculum was placed in the posterior vagina and a Deaver was placed anteriorly.  A circumferential incision was made around the cervix using the Bovie. Posterior cul-de-sac was entered sharply using curved Mayo scissors and a long weighted speculum was placed in the posterior cul-de-sac. Bilateral uterosacral ligaments were grasped with curved Heaneys, cut, and suture ligated.  The anterior cul-de-sac was entered sharply with curved Mayo scissors.  Bilateral cardinal ligaments and uterine arteries were then grasped bilaterally with curved patties, cut with curved Mayo's, and suture ligated.  Remaining pedicles were grasped, cut, and suture ligated.  The fundus was then delivered through the posterior cul- de-sac.  Remaining pedicles were grasped with curved Heaneys, and the  uterus was removed and passed off to be sent to Pathology.  Pedicles were suture ligated and hemostasis was assured bilaterally.  The vaginal cuff was then reapproximated using Monocryl.  Posterior vaginal cuff was reapproximated to the peritoneum using a running locked suture, and then the remaining vaginal cuff was closed using figure-of-eight sutures. Excellent hemostasis was assured and our attention was returned to the abdomen.  CO2 was turned back on and all pedicles and the vaginal cuff were evaluated.  There were some  clots in the lower pelvis, these were Evacuated and the pelvis was irrigated.  Pedicles were inspected and found to be hemostatic.  All instruments and trocars were then removed from the abdomen.  A deep stitch was placed in the infraumbilical incision and then the skin was closed with Vicryl. Suprapubic incision was closed with Vicryl.  Dermabond was placed on both incisions.  She tolerated the procedure well.  Sponge, lap, needle, and instrument counts were correct x2.     Marylynn Pearson, MD     GA/MEDQ  D:  05/05/2014  T:  05/05/2014  Job:  [id number]

## 2014-05-05 NOTE — Anesthesia Postprocedure Evaluation (Addendum)
  Anesthesia Post-op Note  Patient: [name]  Procedure(s) Performed: Procedure(s): LAPAROSCOPIC ASSISTED VAGINAL HYSTERECTOMY (N/A)  Patient Location: [facility]  Anesthesia Type:General  Level of Consciousness: awake, alert , oriented and patient cooperative  Airway and Oxygen Therapy: Patient Spontanous Breathing and Patient connected to nasal cannula oxygen 4L/min with SaO2 97%  Post-op Pain: mild  Post-op Assessment: Patient's Cardiovascular Status Stable, Respiratory Function Stable, No signs of Nausea or vomiting and Pain level controlled  Post-op Vital Signs: stable  Last Vitals:  Filed Vitals:   05/05/14 1320  BP: 109/62  Pulse: 91  Temp: 36.9 C  Resp: 18    Complications: No apparent anesthesia complications

## 2014-05-05 NOTE — Transfer of Care (Signed)
Immediate Anesthesia Transfer of Care Note  Patient: [name]  Procedure(s) Performed: Procedure(s): LAPAROSCOPIC ASSISTED VAGINAL HYSTERECTOMY (N/A)  Patient Location: PACU  Anesthesia Type:General  Level of Consciousness: awake, alert  and oriented  Airway & Oxygen Therapy: Patient Spontanous Breathing and Patient connected to nasal cannula oxygen  Post-op Assessment: Report given to PACU RN and Post -op Vital signs reviewed and stable  Post vital signs: Reviewed and stable  Complications: No apparent anesthesia complications

## 2014-05-05 NOTE — Anesthesia Postprocedure Evaluation (Signed)
  Anesthesia Post-op Note  Patient: [name]  Procedure(s) Performed: Procedure(s): LAPAROSCOPIC ASSISTED VAGINAL HYSTERECTOMY (N/A)  Patient Location: PACU  Anesthesia Type:General  Level of Consciousness: awake, alert  and oriented  Airway and Oxygen Therapy: Patient Spontanous Breathing  Post-op Pain: none  Post-op Assessment: Post-op Vital signs reviewed, Patient's Cardiovascular Status Stable, Respiratory Function Stable, Patent Airway, No signs of Nausea or vomiting and Pain level controlled  Post-op Vital Signs: Reviewed and stable  Last Vitals:  Filed Vitals:   05/05/14 1000  BP: 119/66  Pulse: 77  Temp:   Resp: 17    Complications: No apparent anesthesia complications

## 2014-05-05 NOTE — Anesthesia Preprocedure Evaluation (Addendum)
Anesthesia Evaluation  Patient identified by MRN, date of birth, ID band Patient awake    Reviewed: Allergy & Precautions, H&P , NPO status , Patient's Chart, lab work & pertinent test results  History of Anesthesia Complications (+) PONV  Airway Mallampati: I TM Distance: >3 FB Neck ROM: Full    Dental no notable dental hx. (+) Teeth Intact   Pulmonary neg pulmonary ROS,  breath sounds clear to auscultation  Pulmonary exam normal       Cardiovascular hypertension, Pt. 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 Avoid tylenol   Endo/Other  negative endocrine ROS  Renal/GU negative Renal ROS  negative genitourinary   Musculoskeletal negative musculoskeletal ROS (+)   Abdominal   Peds negative pediatric ROS (+)  Hematology negative hematology ROS (+)   Anesthesia Other Findings Upper front left tooth capped  Reproductive/Obstetrics negative OB ROS                         Anesthesia Physical Anesthesia Plan  ASA: II  Anesthesia Plan: General   Post-op Pain Management:    Induction: Intravenous  Airway Management Planned: Oral ETT  Additional Equipment:   Intra-op Plan:   Post-operative Plan: Extubation in OR  Informed Consent: I have reviewed the patients History and Physical, chart, labs and discussed the procedure including the risks, benefits and alternatives for the proposed anesthesia with the patient or authorized representative who has indicated his/her understanding and acceptance.   Dental advisory given  Plan Discussed with: CRNA  Anesthesia Plan Comments:         Anesthesia Quick Evaluation

## 2014-05-06 ENCOUNTER — Encounter (HOSPITAL_COMMUNITY): Payer: Self-pay | Admitting: Obstetrics and Gynecology

## 2014-05-06 LAB — CBC
HEMATOCRIT: 30.6 % — AB (ref 36.0–46.0)
Hemoglobin: 10.2 g/dL — ABNORMAL LOW (ref 12.0–15.0)
MCH: 30.8 pg (ref 26.0–34.0)
MCHC: 33.3 g/dL (ref 30.0–36.0)
MCV: 92.4 fL (ref 78.0–100.0)
Platelets: 229 10*3/uL (ref 150–400)
RBC: 3.31 MIL/uL — ABNORMAL LOW (ref 3.87–5.11)
RDW: 12 % (ref 11.5–15.5)
WBC: 9.9 10*3/uL (ref 4.0–10.5)

## 2014-05-06 MED ORDER — OXYCODONE-ACETAMINOPHEN 5-325 MG PO TABS: 1.0000 | ORAL_TABLET | ORAL | Status: DC | PRN

## 2014-05-06 MED ORDER — IBUPROFEN 600 MG PO TABS: 600.0000 mg | ORAL_TABLET | Freq: Four times a day (QID) | ORAL | Status: DC | PRN

## 2014-05-06 NOTE — Progress Notes (Signed)
Pt  Out in wheelchair  Teaching  Complete  

## 2014-05-06 NOTE — Discharge Instructions (Signed)
Laparoscopically Assisted Vaginal Hysterectomy, Care After °Refer to this sheet in the next few weeks. These instructions provide you with information on caring for yourself after your procedure. Your health care provider may also give you more specific instructions. Your treatment has been planned according to current medical practices, but problems sometimes occur. Call your health care provider if you have any problems or questions after your procedure. °WHAT TO EXPECT AFTER THE PROCEDURE °After your procedure, it is typical to have the following: °· Abdominal pain. You will be given pain medicine to control it. °· Sore throat from the breathing tube that was inserted during surgery. °HOME CARE INSTRUCTIONS °· Only take over-the-counter or prescription medicines for pain, discomfort, or fever as directed by your health care provider. °· Do not take aspirin. It can cause bleeding. °· Do not drive when taking pain medicine. °· Follow your health care provider's advice regarding diet, exercise, lifting, driving, and general activities. °· Resume your usual diet as directed and allowed. °· Get plenty of rest and sleep. °· Do not douche, use tampons, or have sexual intercourse for at least 6 weeks, or until your health care provider gives you permission. °· Change your bandages (dressings) as directed by your health care provider. °· Monitor your temperature and notify your health care provider of a fever. °· Take showers instead of baths for 2 3 weeks. °· Do not drink alcohol until your health care provider gives you permission. °· If you develop constipation, you may take a mild laxative with your health care provider's permission. Bran foods may help with constipation problems. Drinking enough fluids to keep your urine clear or pale yellow may help as well. °· Try to have someone home with you for 1 2 weeks to help around the house. °· Keep all of your follow-up appointments as directed by your health care  provider. °SEEK MEDICAL CARE IF:  °· You have swelling, redness, or increasing pain around your incision sites. °· You have pus coming from your incision. °· You notice a bad smell coming from your incision. °· Your incision breaks open. °· You feel dizzy or lightheaded. °· You have pain or bleeding when you urinate. °· You have persistent diarrhea. °· You have persistent nausea and vomiting. °· You have abnormal vaginal discharge. °· You have a rash. °· You have any type of abnormal reaction or develop an allergy to your medicine. °· You have poor pain control with your prescribed medicine. °SEEK IMMEDIATE MEDICAL CARE IF:  °· You have a fever. °· You have severe abdominal pain. °· You have chest pain. °· You have shortness of breath. °· You faint. °· You have pain, swelling, or redness in your leg. °· You have heavy vaginal bleeding with blood clots. °Document Released: 12/05/2011 Document Revised: 08/18/2013 Document Reviewed: 07/01/2013 °ExitCare® Patient Information ©2014 [employer]. ° °

## 2014-05-07 NOTE — Discharge Summary (Signed)
[name], [name]               ACCOUNT NO.:  [id number]  MEDICAL RECORD NO.:  [id number]  LOCATION:  6644                          FACILITY:  [facility]  PHYSICIAN:  Marylynn Pearson, MD    DATE OF BIRTH:  08-14-1976  DATE OF ADMISSION:  05/05/2014 DATE OF DISCHARGE:  05/06/2014                              DISCHARGE SUMMARY   ADMISSION DIAGNOSES: 1. Dysmenorrhea. 2. Menorrhagia.  PROCEDURE:  Laparoscopic-assisted vaginal hysterectomy.  HOSPITAL COURSE:  [name] was admitted to the hospital for routine postoperative care.  She did well postoperatively.  Initially, her pain was controlled with IV pain medications.  Once she was able to tolerate p.o., IV was discontinued and p.o. medications were given.  On postoperative day #1, hemoglobin was stable at 10.2.  Pain was well controlled with oral medication.  She was tolerating a regular diet. She was afebrile and her abdomen was soft and nontender.  On exam, incisions were well-approximated without erythema, induration, or drainage.  Vaginal bleeding was scant.  She was ambulating and urinating without difficulty.  She was felt to be stable for discharge home.  She was discharged to resume her routine home medications.  Prescription for Percocet and ibuprofen were given.  She will follow up in the office in 2 weeks or p.r.n.     Marylynn Pearson, MD     GA/MEDQ  D:  05/06/2014  T:  05/07/2014  Job:  [id number]

## 2014-10-04 ENCOUNTER — Other Ambulatory Visit: Payer: Self-pay | Admitting: Obstetrics and Gynecology

## 2014-10-04 DIAGNOSIS — R928 Other abnormal and inconclusive findings on diagnostic imaging of breast: Secondary | ICD-10-CM

## 2014-10-10 ENCOUNTER — Ambulatory Visit
Admission: RE | Admit: 2014-10-10 | Discharge: 2014-10-10 | Disposition: A | Discharge status: Home / Self Care | Payer: BC Managed Care – PPO | Source: Ambulatory Visit | Attending: Obstetrics and Gynecology | Admitting: Obstetrics and Gynecology

## 2014-10-10 ENCOUNTER — Encounter (INDEPENDENT_AMBULATORY_CARE_PROVIDER_SITE_OTHER): Payer: Self-pay

## 2014-10-10 DIAGNOSIS — R928 Other abnormal and inconclusive findings on diagnostic imaging of breast: Secondary | ICD-10-CM

## 2015-03-07 ENCOUNTER — Other Ambulatory Visit: Payer: Self-pay | Admitting: Obstetrics and Gynecology

## 2015-03-07 DIAGNOSIS — N63 Unspecified lump in unspecified breast: Secondary | ICD-10-CM

## 2015-03-31 ENCOUNTER — Ambulatory Visit
Admission: RE | Admit: 2015-03-31 | Discharge: 2015-03-31 | Disposition: A | Discharge status: Home / Self Care | Payer: BLUE CROSS/BLUE SHIELD | Source: Ambulatory Visit | Attending: Obstetrics and Gynecology | Admitting: Obstetrics and Gynecology

## 2015-03-31 ENCOUNTER — Encounter (INDEPENDENT_AMBULATORY_CARE_PROVIDER_SITE_OTHER): Payer: Self-pay

## 2015-03-31 ENCOUNTER — Other Ambulatory Visit: Payer: Self-pay | Admitting: Obstetrics and Gynecology

## 2015-03-31 DIAGNOSIS — N63 Unspecified lump in unspecified breast: Secondary | ICD-10-CM

## 2015-11-01 DIAGNOSIS — I442 Atrioventricular block, complete: Secondary | ICD-10-CM | POA: Insufficient documentation

## 2015-11-01 DIAGNOSIS — Z950 Presence of cardiac pacemaker: Secondary | ICD-10-CM | POA: Insufficient documentation

## 2015-11-09 ENCOUNTER — Other Ambulatory Visit: Payer: Self-pay

## 2015-11-09 DIAGNOSIS — Z1231 Encounter for screening mammogram for malignant neoplasm of breast: Secondary | ICD-10-CM

## 2015-12-15 ENCOUNTER — Ambulatory Visit: Payer: BLUE CROSS/BLUE SHIELD

## 2016-01-16 ENCOUNTER — Other Ambulatory Visit: Payer: Self-pay

## 2016-01-16 ENCOUNTER — Ambulatory Visit
Admission: RE | Admit: 2016-01-16 | Discharge: 2016-01-16 | Disposition: A | Discharge status: Home / Self Care | Payer: 59 | Source: Ambulatory Visit

## 2016-01-16 DIAGNOSIS — Z1231 Encounter for screening mammogram for malignant neoplasm of breast: Secondary | ICD-10-CM

## 2016-06-13 ENCOUNTER — Encounter: Payer: Self-pay | Admitting: Internal Medicine

## 2016-06-18 ENCOUNTER — Encounter: Payer: 59 | Admitting: Internal Medicine

## 2016-07-23 ENCOUNTER — Encounter: Payer: Self-pay | Admitting: Internal Medicine

## 2016-07-23 ENCOUNTER — Ambulatory Visit (INDEPENDENT_AMBULATORY_CARE_PROVIDER_SITE_OTHER): Payer: 59 | Admitting: Internal Medicine

## 2016-07-23 VITALS — BP 126/86 | HR 93 | Ht 60.0 in | Wt 171.8 lb

## 2016-07-23 DIAGNOSIS — I442 Atrioventricular block, complete: Secondary | ICD-10-CM | POA: Diagnosis not present

## 2016-07-23 DIAGNOSIS — Z01810 Encounter for preprocedural cardiovascular examination: Secondary | ICD-10-CM

## 2016-07-23 DIAGNOSIS — Z950 Presence of cardiac pacemaker: Secondary | ICD-10-CM | POA: Diagnosis not present

## 2016-07-23 LAB — CUP PACEART INCLINIC DEVICE CHECK
Battery Impedance: 903 Ohm
Battery Remaining Longevity: 65 mo
Battery Voltage: 2.78 V
Date Time Interrogation Session: 20170725182424
Implantable Lead Implant Date: 19990617
Implantable Lead Location: 753860
Lead Channel Impedance Value: 367 Ohm
Lead Channel Pacing Threshold Amplitude: 1 V
Lead Channel Pacing Threshold Amplitude: 1 V
Lead Channel Pacing Threshold Pulse Width: 0.4 ms
Lead Channel Pacing Threshold Pulse Width: 0.4 ms
Lead Channel Setting Pacing Amplitude: 2.25 V
Lead Channel Setting Pacing Amplitude: 2.5 V
Lead Channel Setting Pacing Pulse Width: 0.4 ms
Lead Channel Setting Sensing Sensitivity: 2 mV
MDC IDC LEAD IMPLANT DT: 19990617
MDC IDC LEAD LOCATION: 753859
MDC IDC MSMT LEADCHNL RA SENSING INTR AMPL: 1 mV
MDC IDC MSMT LEADCHNL RV IMPEDANCE VALUE: 819 Ohm
MDC IDC STAT BRADY AP VP PERCENT: 1 %
MDC IDC STAT BRADY AP VS PERCENT: 0 %
MDC IDC STAT BRADY AS VP PERCENT: 99 %
MDC IDC STAT BRADY AS VS PERCENT: 0 %

## 2016-07-23 NOTE — Patient Instructions (Signed)
Medication Instructions:  Your physician recommends that you continue on your current medications as directed. Please refer to the Current Medication list given to you today.  Labwork: None ordered  Testing/Procedures: None ordered  Follow-Up: Remote monitoring is used to monitor your Pacemaker of ICD from home. This monitoring reduces the number of office visits required to check your device to one time per year. It allows [place] to keep an eye on the functioning of your device to ensure it is working properly. You are scheduled for a device check from home on 10/22/16. You may send your transmission at any time that day. If you have a wireless device, the transmission will be sent automatically. After your physician reviews your transmission, you will receive a postcard with your next transmission date.  Your physician wants you to follow-up in: 1 year with Dr. Lovena Le.  You will receive a reminder letter in the mail two months in advance. If you don't receive a letter, please call our office to schedule the follow-up appointment.  If you need a refill on your cardiac medications before your next appointment, please call your pharmacy.  Thank you for choosing [facility]!!   [phone]

## 2016-07-23 NOTE — Progress Notes (Signed)
HPI [name] returns today for followup of her PPM. She is a pleasant 40 yo woman with CHB, s/p PPM insertion, along with HTN. In the interim, she has done well with no chest pain, sob, palpitations or syncope No edema. She is pending plastic surgery.  Allergies  Allergen Reactions  . Adhesive [Tape] Other (See Comments)    Blisters from steristrips     Current Outpatient Prescriptions  Medication Sig Dispense Refill  . citalopram (CELEXA) 20 MG tablet Take 20 mg by mouth daily.    . valsartan-hydrochlorothiazide (DIOVAN-HCT) 160-25 MG per tablet Take 1 tablet by mouth daily.     No current facility-administered medications for this visit.      Past Medical History:  Diagnosis Date  . Anxiety   . 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 Hypertension   . Seasonal allergies   . SVD (spontaneous vaginal delivery)    x 2    ROS:   All systems reviewed and negative except as noted in the HPI.   Past Surgical History:  Procedure Laterality Date  . BREAST SURGERY     augmentation  . [place], 2006   x 2  . CHOLECYSTECTOMY    . DILATION AND CURETTAGE OF UTERUS  08/2006  . INSERT / REPLACE / [place]  1999, 2009   replaced in 2009  . LAPAROSCOPIC ASSISTED VAGINAL HYSTERECTOMY N/A 05/05/2014   Procedure: LAPAROSCOPIC ASSISTED VAGINAL HYSTERECTOMY;  Surgeon: Marylynn Pearson, MD;  Location: [facility];  Service: Gynecology;  Laterality: N/A;  . TUBAL LIGATION    . WISDOM TOOTH EXTRACTION  1994     Family History  Problem Relation Age of Onset  . Hypertension Mother      Social History   Social History  . Marital status: Married    Spouse name: N/A  . Number of children: N/A  . Years of education: N/A   Occupational History  . Not on file.   Social History Main Topics  . Smoking status: Never Smoker  . Smokeless tobacco: Never Used  . Alcohol use Yes     Comment: 2 glases daily wine  . Drug  use: No  . Sexual activity: Yes    Birth control/ protection: Surgical   Other Topics Concern  . Not on file   Social History Narrative  . No narrative on file     BP 126/86   Pulse 93   Ht 5' (1.524 m)   Wt 171 lb 12.8 oz (77.9 kg)   LMP 04/29/2014   BMI 33.55 kg/m   Physical Exam:  Well appearing middle aged woman, NAD HEENT: Unremarkable Neck:  No JVD, no thyromegally Lymphatics:  No adenopathy Back:  No CVA tenderness Lungs:  Clear with no wheezes, rales or rhonchi, well healed right sided PPM incision. HEART:  Regular rate rhythm, no murmurs, no rubs, no clicks Abd:  soft, positive bowel sounds, no organomegally, no rebound, no guarding Ext:  2 plus pulses, no edema, no cyanosis, no clubbing Skin:  No rashes no nodules Neuro:  CN II through XII intact, motor grossly intact  EKG - NSR with ventricular pacing  DEVICE  Normal device function.  See PaceArt for details.   Assess/Plan: 1. Congenital complete heart block - she is asymptomatic, s/p PPM insertion. 2. PPM - her medtronic DDD PM is working normally. Will recheck in several months 3. Preoperative evaluation -  she is low surgical risk for pending abdominal surgery.   Mikle Bosworth.D.

## 2016-10-22 ENCOUNTER — Encounter: Payer: 59 | Admitting: *Deleted

## 2016-10-22 ENCOUNTER — Telehealth: Payer: Self-pay | Admitting: Cardiology

## 2016-10-22 NOTE — Telephone Encounter (Signed)
LMOVM reminding pt to send remote transmission.   

## 2016-10-24 ENCOUNTER — Encounter: Payer: Self-pay | Admitting: Cardiology

## 2016-10-28 ENCOUNTER — Ambulatory Visit (INDEPENDENT_AMBULATORY_CARE_PROVIDER_SITE_OTHER): Payer: Self-pay | Admitting: *Deleted

## 2016-10-28 DIAGNOSIS — I442 Atrioventricular block, complete: Secondary | ICD-10-CM

## 2016-10-29 NOTE — Progress Notes (Signed)
Remote pacemaker transmission.   

## 2016-11-01 ENCOUNTER — Encounter: Payer: Self-pay | Admitting: Cardiology

## 2017-01-27 ENCOUNTER — Encounter: Payer: Self-pay | Admitting: *Deleted

## 2017-01-27 ENCOUNTER — Telehealth: Payer: Self-pay | Admitting: Cardiology

## 2017-01-27 NOTE — Telephone Encounter (Signed)
LMOVM reminding pt to send remote transmission.   

## 2017-01-30 ENCOUNTER — Encounter: Payer: Self-pay | Admitting: Cardiology

## 2017-02-05 ENCOUNTER — Ambulatory Visit (INDEPENDENT_AMBULATORY_CARE_PROVIDER_SITE_OTHER): Payer: Self-pay | Admitting: *Deleted

## 2017-02-05 DIAGNOSIS — I442 Atrioventricular block, complete: Secondary | ICD-10-CM

## 2017-02-06 NOTE — Progress Notes (Signed)
Remote pacemaker transmission.   

## 2017-02-07 ENCOUNTER — Encounter: Payer: Self-pay | Admitting: Cardiology

## 2017-02-07 LAB — CUP PACEART REMOTE DEVICE CHECK
Battery Impedance: 1059 Ohm
Battery Remaining Longevity: 58 mo
Battery Voltage: 2.78 V
Brady Statistic AP VS Percent: 0 %
Brady Statistic AS VP Percent: 99 %
Brady Statistic AS VS Percent: 0 %
Date Time Interrogation Session: 20180207114301
Implantable Lead Implant Date: 19990617
Implantable Lead Location: 753859
Lead Channel Impedance Value: 811 Ohm
Lead Channel Pacing Threshold Amplitude: 1.125 V
Lead Channel Pacing Threshold Amplitude: 1.25 V
Lead Channel Pacing Threshold Pulse Width: 0.4 ms
Lead Channel Pacing Threshold Pulse Width: 0.4 ms
Lead Channel Setting Pacing Amplitude: 2.5 V
Lead Channel Setting Pacing Pulse Width: 0.4 ms
MDC IDC LEAD IMPLANT DT: 19990617
MDC IDC LEAD LOCATION: 753860
MDC IDC MSMT LEADCHNL RA IMPEDANCE VALUE: 392 Ohm
MDC IDC PG IMPLANT DT: 20090827
MDC IDC SET LEADCHNL RV PACING AMPLITUDE: 2.5 V
MDC IDC SET LEADCHNL RV SENSING SENSITIVITY: 2 mV
MDC IDC STAT BRADY AP VP PERCENT: 1 %

## 2017-04-02 ENCOUNTER — Other Ambulatory Visit: Payer: Self-pay | Admitting: Internal Medicine

## 2017-04-02 MED ORDER — VALSARTAN-HYDROCHLOROTHIAZIDE 160-25 MG PO TABS: 1.0000 | ORAL_TABLET | Freq: Every day | ORAL | 2 refills | Status: DC

## 2017-04-15 DIAGNOSIS — R1030 Lower abdominal pain, unspecified: Secondary | ICD-10-CM | POA: Diagnosis not present

## 2017-04-28 DIAGNOSIS — Z01419 Encounter for gynecological examination (general) (routine) without abnormal findings: Secondary | ICD-10-CM | POA: Diagnosis not present

## 2017-04-28 DIAGNOSIS — Z1231 Encounter for screening mammogram for malignant neoplasm of breast: Secondary | ICD-10-CM | POA: Diagnosis not present

## 2017-05-06 ENCOUNTER — Inpatient Hospital Stay (HOSPITAL_COMMUNITY): Payer: 59

## 2017-05-06 ENCOUNTER — Encounter (HOSPITAL_COMMUNITY): Payer: Self-pay | Admitting: *Deleted

## 2017-05-06 ENCOUNTER — Inpatient Hospital Stay (EMERGENCY_DEPARTMENT_HOSPITAL)
Admission: AD | Admit: 2017-05-06 | Discharge: 2017-05-06 | Disposition: A | Discharge status: Home / Self Care | Payer: 59 | Source: Ambulatory Visit | Attending: Obstetrics and Gynecology | Admitting: Obstetrics and Gynecology

## 2017-05-06 DIAGNOSIS — N83519 Torsion of ovary and ovarian pedicle, unspecified side: Secondary | ICD-10-CM | POA: Diagnosis not present

## 2017-05-06 DIAGNOSIS — R102 Pelvic and perineal pain: Secondary | ICD-10-CM | POA: Diagnosis not present

## 2017-05-06 DIAGNOSIS — R1900 Intra-abdominal and pelvic swelling, mass and lump, unspecified site: Secondary | ICD-10-CM

## 2017-05-06 DIAGNOSIS — N83512 Torsion of left ovary and ovarian pedicle: Secondary | ICD-10-CM | POA: Diagnosis not present

## 2017-05-06 LAB — CBC
HCT: 39.7 % (ref 36.0–46.0)
HEMOGLOBIN: 13.7 g/dL (ref 12.0–15.0)
MCH: 31.6 pg (ref 26.0–34.0)
MCHC: 34.5 g/dL (ref 30.0–36.0)
MCV: 91.7 fL (ref 78.0–100.0)
Platelets: 301 10*3/uL (ref 150–400)
RBC: 4.33 MIL/uL (ref 3.87–5.11)
RDW: 12.5 % (ref 11.5–15.5)
WBC: 11.6 10*3/uL — ABNORMAL HIGH (ref 4.0–10.5)

## 2017-05-06 MED ORDER — IOPAMIDOL (ISOVUE-300) INJECTION 61%
30.0000 mL | Freq: Once | INTRAVENOUS | Status: AC | PRN
  Administered 2017-05-06: 30 mL via ORAL

## 2017-05-06 MED ORDER — IOPAMIDOL (ISOVUE-300) INJECTION 61%
100.0000 mL | Freq: Once | INTRAVENOUS | Status: AC | PRN
Start: 2017-05-06 — End: 2017-05-06
  Administered 2017-05-06: 100 mL via INTRAVENOUS

## 2017-05-06 NOTE — MAU Note (Signed)
Patient sent over by Dr. Julien Girt for further evaluation of pelvic pain. Patient denies pain at this time. Has had a hysterectomy

## 2017-05-06 NOTE — MAU Provider Note (Signed)
History     CSN: [id number]  Arrival date and time: 05/06/17 1601   None     Chief Complaint  Patient presents with  . Pelvic Pain   41 year old G5 P4 who presents today from office at the direction of Dr. Danae Chen for concern for ovarian torsion. Patient has history of hysterectomy but does have ovaries in place. Patient reported acute onset a 10 out of 10 abdominal pain Sunday night. Since it is somewhat improved. In the office ultrasound was performed which showed a large mass likely the ovary that had no blood flow and there was some concern for ovarian torsion. She was sent MA U with plans for CT scan and further workup. In the MA U patient reports 1-2 out of 10 pain.    OB History    Gravida Para Term Preterm AB Living   5 4 4   1 4    SAB TAB Ectopic Multiple Live Births   1       4      Past Medical History:  Diagnosis Date  . Anxiety   . 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 Hypertension   . Seasonal allergies   . SVD (spontaneous vaginal delivery)    x 2    Past Surgical History:  Procedure Laterality Date  . BREAST SURGERY     augmentation  . [place], 2006   x 2  . CHOLECYSTECTOMY    . DILATION AND CURETTAGE OF UTERUS  08/2006  . INSERT / REPLACE / [place]  1999, 2009   replaced in 2009  . LAPAROSCOPIC ASSISTED VAGINAL HYSTERECTOMY N/A 05/05/2014   Procedure: LAPAROSCOPIC ASSISTED VAGINAL HYSTERECTOMY;  Surgeon: Marylynn Pearson, MD;  Location: [facility];  Service: Gynecology;  Laterality: N/A;  . TUBAL LIGATION    . WISDOM TOOTH EXTRACTION  1994    Family History  Problem Relation Age of Onset  . Hypertension Mother     Social History  Substance Use Topics  . Smoking status: Never Smoker  . Smokeless tobacco: Never Used  . Alcohol use Yes     Comment: 2-or more glases daily wine    Allergies:  Allergies  Allergen Reactions  . Adhesive [Tape] Other (See Comments)    Blisters from  steristrips    Prescriptions Prior to Admission  Medication Sig Dispense Refill Last Dose  . citalopram (CELEXA) 20 MG tablet Take 20 mg by mouth daily.   Taking  . valsartan-hydrochlorothiazide (DIOVAN-HCT) 160-25 MG tablet Take 1 tablet by mouth daily. 30 tablet 2     Review of Systems  Constitutional: Negative for chills and fever.  HENT: Negative for congestion and rhinorrhea.   Respiratory: Negative for apnea and shortness of breath.   Cardiovascular: Negative for chest pain and palpitations.  Gastrointestinal: Negative for abdominal distention, abdominal pain, constipation, diarrhea, nausea and vomiting.  Genitourinary: Negative for difficulty urinating, dysuria, flank pain, frequency and hematuria.  Neurological: Negative for dizziness and light-headedness.   Physical Exam   Blood pressure 123/70, pulse 94, temperature 99.8 F (37.7 C), temperature source Oral, resp. rate 18, height 5' 1.5" (1.562 m), weight 170 lb (77.1 kg), last menstrual period 04/29/2014, SpO2 100 %.  Physical Exam  Constitutional: She is oriented to person, place, and time. She appears well-developed and well-nourished.  Cardiovascular: Normal rate and intact distal pulses.   Respiratory: Effort normal. No respiratory distress.  GI: Soft. Bowel sounds are normal.  She exhibits no distension. There is tenderness. There is no rebound and no guarding.  Patient with very minimal diffuse abdominal tenderness  Musculoskeletal: Normal range of motion. She exhibits no edema.  Neurological: She is alert and oriented to person, place, and time.  Skin: Skin is warm and dry.  Psychiatric: She has a normal mood and affect. Her behavior is normal.    MAU Course  Procedures  MDM In MA U patient underwent CBC which was unremarkable. She additionally had CT of the abdomen which revealed a heterogeneous likely infarcted ovary. Case was discussed with Dr. Helane Rima and given that she had minimal pain at time of evaluation  likely the left ovary has been infarcted. Dr. Julien Girt will operate on Thursday morning.  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

## 2017-05-06 NOTE — MAU Note (Signed)
Pt presents to mau from [place] office to rule out torsion. Pt denies pain.

## 2017-05-06 NOTE — MAU Note (Signed)
Dr.Grewal came to MAU and stated "call with the CT results and they will either admit her and do surgery or schedule surgery with adkins at a later time.

## 2017-05-06 NOTE — Discharge Instructions (Signed)
Laparoscopic Ovarian Torsion Surgery, Care After Refer to this sheet in the next few weeks. These instructions provide you with information about caring for yourself after your procedure. Your health care provider may also give you more specific instructions. Your treatment has been planned according to current medical practices, but problems sometimes occur. Call your health care provider if you have any problems or questions after your procedure. What can I expect after the procedure? After your procedure, it is common to have:  A small amount of blood or clear fluid coming from the cuts made during surgery (incisions).  Mild cramping in your lower abdomen. Follow these instructions at home: Incision care    Follow instructions from your health care provider about how to take care of your incisions. Make sure you:  Wash your hands with soap and water before you change your bandage (dressing). If soap and water are not available, use hand sanitizer.  Change your dressing as told by your health care provider.  Leave stitches (sutures), skin glue, or adhesive strips in place. These skin closures may need to be in place for 2 weeks or longer. If adhesive strip edges start to loosen and curl up, you may trim the loose edges. Do not remove adhesive strips completely unless your health care provider tells you to do that.  Check your incision areas every day for signs of infection. Check for:  More redness, swelling, or pain.  More fluid or blood.  Warmth.  Pus or a bad smell.  Keep your incision areas clean and dry. Do not take baths, swim, or use a hot tub until your health care provider approves. General instructions   Take over-the-counter and prescription medicines only as told by your health care provider.  Return to your normal activities as told by your health care provider. Ask your health care provider what activities are safe for you. Ask about:  Returning to work and  exercise.  If you have any weight restrictions.  When it is okay to resume sexual activity.  Drink enough fluid to keep your urine clear or pale yellow.  Keep all follow-up visits as told by your health care provider. This is important. Contact a health care provider if:  You have unusual bleeding or discharge from your vagina.  You have pain in your abdomen that gets worse or does not get better with medicine.  You feel nauseous.  You havemore redness, swelling, or pain at the site of your incisions.  You have more fluid or blood coming from your incisions.  Your incisions feel warm to the touch.  Your have pus or a bad smell coming from your incisions. Get help right away if:  You have a fever.  You have a lot of unusual bleeding or discharge from your vagina.  You have severe pain in your abdomen.  You cannot stop vomiting.  You have nausea that does not get better. This information is not intended to replace advice given to you by your health care provider. Make sure you discuss any questions you have with your health care provider. Document Released: 12/05/2011 Document Revised: 08/14/2016 Document Reviewed: 11/27/2015 Elsevier Interactive Patient Education  2017 [employer] Ovarian Torsion The ovaries are small organs in the lower abdomen that produce eggs in women. Ovarian torsion is when an ovary becomes twisted and cuts off its own blood supply. In many cases, both the ovary and the fallopian tube on the affected side become twisted (adnexal torsion). This causes the  ovary to swell. If left untreated, the ovary may become infected, which can be painful. Ovarian torsion can happen at any age. Ovarian torsion is a medical emergency that must be treated quickly, generally with surgery. What are the causes? Ovarian torsion is commonly caused by having an ovary that is larger than normal (enlarged). It may also be a complication of pregnancy. In some cases, the cause  may not be known. What increases the risk? This condition is more likely to develop in women who:  Have ovaries that are enlarged because of ovarian cysts.  Take fertility medicine to become pregnant.  Are pregnant.  Have a history of ovarian torsion. What are the signs or symptoms? The main symptom of this condition is pain in the lower abdomen, usually on one side of the body. The pain may be severe and may come and go suddenly. Other symptoms may include:  Abdominal pain that spreads to the sides, lower back, or thighs.  Nausea and vomiting.  Fever. How is this diagnosed? This condition is diagnosed based on your medical history and a physical exam. Your health care provider may perform a pelvic exam, and he or she may feel your abdomen to check for an enlarged ovary. You may have tests, such as:  A pregnancy test.  An ultrasound to look at your ovary and the blood flow to your ovaries.  CT scan.  MRI.  Diagnostic laparoscopy. This is a procedure in which a thin tube with a light and camera on the end (laparoscope) is inserted through a small incision in your abdomen to look at your ovary. How is this treated? This condition is treated with surgery to untwist the ovary (laparoscopic ovarian torsion surgery). If treated early, ovarian function can usually be restored. If the ovary cannot be untwisted, the ovary will have to be surgically removed (oophorectomy). The decision to remove your ovary will be based on your age, your desire to have children, and the condition of your ovary at the time of surgery. This information is not intended to replace advice given to you by your health care provider. Make sure you discuss any questions you have with your health care provider. Document Released: 12/05/2011 Document Revised: 01/04/2016 Document Reviewed: 12/10/2015 Elsevier Interactive Patient Education  2017 [employer].

## 2017-05-07 ENCOUNTER — Encounter (HOSPITAL_COMMUNITY): Payer: Self-pay

## 2017-05-07 ENCOUNTER — Telehealth: Payer: Self-pay | Admitting: Internal Medicine

## 2017-05-07 ENCOUNTER — Ambulatory Visit (INDEPENDENT_AMBULATORY_CARE_PROVIDER_SITE_OTHER): Payer: Self-pay | Admitting: *Deleted

## 2017-05-07 ENCOUNTER — Encounter (HOSPITAL_COMMUNITY)
Admission: RE | Admit: 2017-05-07 | Discharge: 2017-05-07 | Disposition: A | Discharge status: Home / Self Care | Payer: 59 | Source: Ambulatory Visit | Attending: Obstetrics and Gynecology | Admitting: Obstetrics and Gynecology

## 2017-05-07 ENCOUNTER — Telehealth: Payer: Self-pay | Admitting: Cardiology

## 2017-05-07 DIAGNOSIS — I442 Atrioventricular block, complete: Secondary | ICD-10-CM

## 2017-05-07 HISTORY — DX: Presence of cardiac pacemaker: Z95.0

## 2017-05-07 LAB — BASIC METABOLIC PANEL
Anion gap: 9 (ref 5–15)
BUN: 11 mg/dL (ref 6–20)
CHLORIDE: 103 mmol/L (ref 101–111)
CO2: 26 mmol/L (ref 22–32)
CREATININE: 0.71 mg/dL (ref 0.44–1.00)
Calcium: 9.5 mg/dL (ref 8.9–10.3)
GFR calc Af Amer: >60 mL/min (ref >60)
GFR calc non Af Amer: >60 mL/min (ref >60)
Glucose, Bld: 86 mg/dL (ref 65–99)
Potassium: 3.5 mmol/L (ref 3.5–5.1)
SODIUM: 138 mmol/L (ref 135–145)

## 2017-05-07 NOTE — Telephone Encounter (Signed)
error 

## 2017-05-07 NOTE — H&P (Signed)
[name] is an 41 y.o. female with a known torsed infarcted ovary presents for surgical mngt.  Pain is minimal.  No fevers, nausea.     Menstrual History: Patient's last menstrual period was 04/29/2014.    Past Medical History:  Diagnosis Date  . Anxiety   . 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 Hypertension   . Seasonal allergies   . SVD (spontaneous vaginal delivery)    x 2    Past Surgical History:  Procedure Laterality Date  . BREAST SURGERY     augmentation  . [place], 2006   x 2  . CHOLECYSTECTOMY    . DILATION AND CURETTAGE OF UTERUS  08/2006  . INSERT / REPLACE / [place]  1999, 2009   replaced in 2009  . LAPAROSCOPIC ASSISTED VAGINAL HYSTERECTOMY N/A 05/05/2014   Procedure: LAPAROSCOPIC ASSISTED VAGINAL HYSTERECTOMY;  Surgeon: Marylynn Pearson, MD;  Location: [facility];  Service: Gynecology;  Laterality: N/A;  . TUBAL LIGATION    . WISDOM TOOTH EXTRACTION  1994  abdominalplasty 2017  Family History  Problem Relation Age of Onset  . Hypertension Mother     Social History:  reports that she has never smoked. She has never used smokeless tobacco. She reports that she drinks alcohol. She reports that she does not use drugs.  Allergies:  Allergies  Allergen Reactions  . Adhesive [Tape] Other (See Comments)    Blisters from steristrips    No prescriptions prior to admission.    ROS  Last menstrual period 04/29/2014. Physical Exam  AF, VSS Gen - NAD Lungs - clear Abd - soft, tender LLQ, ND Ext - NT, no edema    Results for orders placed or performed during the hospital encounter of 05/06/17 (from the past 24 hour(s))  CBC     Status: Abnormal   Collection Time: 05/06/17  5:10 PM  Result Value Ref Range   WBC 11.6 (H) 4.0 - 10.5 K/uL   RBC 4.33 3.87 - 5.11 MIL/uL   Hemoglobin 13.7 12.0 - 15.0 g/dL   HCT 39.7 36.0 - 46.0 %   MCV 91.7 78.0 - 100.0 fL   MCH 31.6 26.0 - 34.0 pg   MCHC  34.5 30.0 - 36.0 g/dL   RDW 12.5 11.5 - 15.5 %   Platelets 301 150 - 400 K/uL    Ct Abdomen Pelvis W Contrast  Result Date: 05/06/2017 CLINICAL DATA:  Pelvic pain EXAM: CT ABDOMEN AND PELVIS WITH CONTRAST TECHNIQUE: Multidetector CT imaging of the abdomen and pelvis was performed using the standard protocol following bolus administration of intravenous contrast. CONTRAST:  18mL ISOVUE-300 IOPAMIDOL (ISOVUE-300) INJECTION 61%, 150mL ISOVUE-300 IOPAMIDOL (ISOVUE-300) INJECTION 61% COMPARISON:  None. FINDINGS: Lower chest: No pulmonary nodules. No visible pleural or pericardial effusion. Hepatobiliary: Normal hepatic size and contours without focal liver lesion. No perihepatic ascites. No intra- or extrahepatic biliary dilatation. Status post cholecystectomy Pancreas: Normal pancreatic contours and enhancement. No peripancreatic fluid collection or pancreatic ductal dilatation. Spleen: Normal. Adrenals/Urinary Tract: Normal adrenal glands. No hydronephrosis or solid renal mass. Bilobed left renal cyst measures up to 7.7 cm. Stomach/Bowel: There is no hiatal hernia. The stomach and duodenum are normal. There is no dilated small bowel or enteric inflammation. There is no colonic abnormality. The appendix is normal. Vascular/Lymphatic: Normal course and caliber of the major abdominal vessels. No abdominal or pelvic adenopathy. Reproductive: The left ovary is massively enlarged with heterogeneous attenuation.  Overall, it measures 8.5 x 8.1 cm. There is adjacent free fluid. Status post hysterectomy. Musculoskeletal: No lytic or blastic osseous lesion. Normal visualized extrathoracic and extraperitoneal soft tissues. Other: No contributory non-categorized findings. IMPRESSION: Enlarged left ovary with areas of decreased enhancement suggesting ischemia. This appearance is highly concerning for ovarian torsion, though CT is less effective at evaluating the vasculature than is Doppler ultrasound. Critical Value/emergent  results were called by telephone at the time of interpretation on 05/06/2017 at 7:53 pm to Dr. Jacquiline Doe , who verbally acknowledged these results. Electronically Signed   By: Ulyses Jarred M.D.   On: 05/06/2017 19:51    Assessment/Plan: Infarcted torsed left ovary laportomy with excision of torsed ovary  Istvan Behar 05/07/2017, 10:18 AM

## 2017-05-07 NOTE — Progress Notes (Signed)
Remote pacemaker transmission.   

## 2017-05-07 NOTE — Pre-Procedure Instructions (Signed)
Faxed Perioperative Prescription for Implanted cCrdiac Device Programming to Cardiologist Dr Cristopher Peru at [facility]. Phone [phone], Fax [phone].  Form was completed and fax back to [facility].  Form placed inside patient's chart.

## 2017-05-07 NOTE — Telephone Encounter (Signed)
New message    Arbie Cookey from [facility] is calling because pt is scheduled for a procedure tomorrow at 7:30am. She faxed over a form about what to do with her device during procedure.

## 2017-05-07 NOTE — Patient Instructions (Signed)
Your procedure is scheduled on:  Thursday, May 10  Enter through the Main Entrance of [facility] at: 6 am  Pick up the phone at the desk and dial [phone].  Call this number if you have problems the morning of surgery: [phone].  Remember: Do NOT eat or drink (including water) after midnight tonight.  Take these medicines the morning of surgery with a SIP OF WATER:  Wellbutrin and valsartan/hctz  Do NOT wear jewelry (body piercing), metal hair clips/bobby pins, make-up, or nail polish. Do NOT wear lotions, powders, or perfumes.  You may wear deoderant. Do NOT shave for 48 hours prior to surgery. Do NOT bring valuables to the hospital. Contacts may not be worn into surgery.  Leave suitcase in car.  After surgery it may be brought to your room.  For patients admitted to the hospital, checkout time is 11:00 AM the day of discharge. Have a responsible adult drive you home and stay with you for 24 hours after your procedure.  Home with Husband [name] cell [phone]

## 2017-05-07 NOTE — Telephone Encounter (Signed)
LMOVM reminding pt to send remote transmission.   

## 2017-05-07 NOTE — Telephone Encounter (Signed)
LM for Arbie Cookey at [facility] pre-op. Device perioperative management form completed and faxed- fax confirmation received.

## 2017-05-07 NOTE — Anesthesia Preprocedure Evaluation (Addendum)
Anesthesia Evaluation  Patient identified by MRN, date of birth, ID band Patient awake    Reviewed: Allergy & Precautions, H&P , NPO status , Patient's Chart, lab work & pertinent test results  History of Anesthesia Complications (+) PONV  Airway Mallampati: I  TM Distance: >3 FB Neck ROM: Full    Dental no notable dental hx. (+) Dental Advisory Given   Pulmonary neg pulmonary ROS,    Pulmonary exam normal        Cardiovascular hypertension, Pt. 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)      Neuro/Psych negative neurological ROS  negative psych ROS   GI/Hepatic negative GI ROS, Neg liver ROS,   Endo/Other  negative endocrine ROS  Renal/GU negative Renal ROS  negative genitourinary   Musculoskeletal negative musculoskeletal ROS (+)   Abdominal   Peds negative pediatric ROS (+)  Hematology negative hematology ROS (+)   Anesthesia Other Findings Upper front left tooth capped  Reproductive/Obstetrics negative OB ROS                            Anesthesia Physical  Anesthesia Plan  ASA: II  Anesthesia Plan: General   Post-op Pain Management:    Induction: Intravenous  Airway Management Planned: Oral ETT  Additional Equipment:   Intra-op Plan:   Post-operative Plan: Extubation in OR  Informed Consent: I have reviewed the patients History and Physical, chart, labs and discussed the procedure including the risks, benefits and alternatives for the proposed anesthesia with the patient or authorized representative who has indicated his/her understanding and acceptance.   Dental advisory given  Plan Discussed with: CRNA, Anesthesiologist and Surgeon  Anesthesia Plan Comments:        Anesthesia Quick Evaluation

## 2017-05-08 ENCOUNTER — Inpatient Hospital Stay (HOSPITAL_COMMUNITY): Payer: 59 | Admitting: Anesthesiology

## 2017-05-08 ENCOUNTER — Encounter (HOSPITAL_COMMUNITY)
Admission: RE | Disposition: A | Discharge status: Home / Self Care | Payer: Self-pay | Source: Ambulatory Visit | Attending: Obstetrics and Gynecology

## 2017-05-08 ENCOUNTER — Inpatient Hospital Stay (HOSPITAL_COMMUNITY)
Admission: RE | Admit: 2017-05-08 | Discharge: 2017-05-09 | DRG: 742 | Disposition: A | Discharge status: Home / Self Care | Payer: 59 | Source: Ambulatory Visit | Attending: Obstetrics and Gynecology | Admitting: Obstetrics and Gynecology

## 2017-05-08 ENCOUNTER — Encounter (HOSPITAL_COMMUNITY): Payer: Self-pay | Admitting: *Deleted

## 2017-05-08 DIAGNOSIS — R110 Nausea: Secondary | ICD-10-CM | POA: Diagnosis not present

## 2017-05-08 DIAGNOSIS — Z90710 Acquired absence of both cervix and uterus: Secondary | ICD-10-CM | POA: Diagnosis not present

## 2017-05-08 DIAGNOSIS — Q246 Congenital heart block: Secondary | ICD-10-CM | POA: Diagnosis not present

## 2017-05-08 DIAGNOSIS — N83512 Torsion of left ovary and ovarian pedicle: Principal | ICD-10-CM | POA: Diagnosis present

## 2017-05-08 DIAGNOSIS — I10 Essential (primary) hypertension: Secondary | ICD-10-CM | POA: Diagnosis present

## 2017-05-08 DIAGNOSIS — Z950 Presence of cardiac pacemaker: Secondary | ICD-10-CM | POA: Diagnosis not present

## 2017-05-08 HISTORY — PX: LAPAROTOMY: SHX154

## 2017-05-08 SURGERY — LAPAROTOMY
Anesthesia: General | Site: Abdomen | Laterality: Left

## 2017-05-08 MED ORDER — ONDANSETRON HCL 4 MG/2ML IJ SOLN
4.0000 mg | Freq: Four times a day (QID) | INTRAMUSCULAR | Status: DC | PRN
  Administered 2017-05-08: 4 mg via INTRAVENOUS
  Filled 2017-05-08: qty 2

## 2017-05-08 MED ORDER — FENTANYL CITRATE (PF) 250 MCG/5ML IJ SOLN
INTRAMUSCULAR | Status: AC
  Filled 2017-05-08: qty 5

## 2017-05-08 MED ORDER — HYDROMORPHONE HCL 1 MG/ML IJ SOLN
0.2500 mg | INTRAMUSCULAR | Status: DC | PRN
  Administered 2017-05-08 (×3): 0.5 mg via INTRAVENOUS

## 2017-05-08 MED ORDER — SUGAMMADEX SODIUM 200 MG/2ML IV SOLN
INTRAVENOUS | Status: DC | PRN
  Administered 2017-05-08: 200 mg via INTRAVENOUS

## 2017-05-08 MED ORDER — CEFOTETAN DISODIUM-DEXTROSE 2-2.08 GM-% IV SOLR
INTRAVENOUS | Status: AC
  Filled 2017-05-08: qty 50

## 2017-05-08 MED ORDER — LIDOCAINE HCL 1 % IJ SOLN
INTRAMUSCULAR | Status: AC
  Filled 2017-05-08: qty 10

## 2017-05-08 MED ORDER — OXYCODONE-ACETAMINOPHEN 5-325 MG PO TABS
1.0000 | ORAL_TABLET | ORAL | Status: DC | PRN
  Administered 2017-05-09 (×2): 2 via ORAL
  Filled 2017-05-08 (×2): qty 2

## 2017-05-08 MED ORDER — CITALOPRAM HYDROBROMIDE 20 MG PO TABS
20.0000 mg | ORAL_TABLET | Freq: Every day | ORAL | Status: DC
  Filled 2017-05-08: qty 1

## 2017-05-08 MED ORDER — PROPOFOL 10 MG/ML IV BOLUS
INTRAVENOUS | Status: AC
  Filled 2017-05-08: qty 20

## 2017-05-08 MED ORDER — HYDROCHLOROTHIAZIDE 25 MG PO TABS
25.0000 mg | ORAL_TABLET | Freq: Every day | ORAL | Status: DC
  Administered 2017-05-09: 25 mg via ORAL
  Filled 2017-05-08: qty 1

## 2017-05-08 MED ORDER — MENTHOL 3 MG MT LOZG
1.0000 | LOZENGE | OROMUCOSAL | Status: DC | PRN
Start: 2017-05-08 — End: 2017-05-09

## 2017-05-08 MED ORDER — ONDANSETRON HCL 4 MG PO TABS: 4.0000 mg | ORAL_TABLET | Freq: Four times a day (QID) | ORAL | Status: DC | PRN

## 2017-05-08 MED ORDER — CEFOTETAN DISODIUM-DEXTROSE 2-2.08 GM-% IV SOLR
2.0000 g | INTRAVENOUS | Status: AC
  Administered 2017-05-08: 2 g via INTRAVENOUS

## 2017-05-08 MED ORDER — SCOPOLAMINE 1 MG/3DAYS TD PT72: 1.0000 | MEDICATED_PATCH | TRANSDERMAL | Status: DC

## 2017-05-08 MED ORDER — SCOPOLAMINE 1 MG/3DAYS TD PT72
MEDICATED_PATCH | TRANSDERMAL | Status: AC
  Filled 2017-05-08: qty 1

## 2017-05-08 MED ORDER — MIDAZOLAM HCL 2 MG/2ML IJ SOLN
INTRAMUSCULAR | Status: AC
  Filled 2017-05-08: qty 2

## 2017-05-08 MED ORDER — FENTANYL CITRATE (PF) 250 MCG/5ML IJ SOLN
INTRAMUSCULAR | Status: DC | PRN
  Administered 2017-05-08: 50 ug via INTRAVENOUS
  Administered 2017-05-08: 100 ug via INTRAVENOUS
  Administered 2017-05-08 (×3): 50 ug via INTRAVENOUS
  Administered 2017-05-08 (×2): 100 ug via INTRAVENOUS

## 2017-05-08 MED ORDER — HYDROMORPHONE HCL 1 MG/ML IJ SOLN
INTRAMUSCULAR | Status: AC
  Administered 2017-05-08: 0.5 mg via INTRAVENOUS
  Filled 2017-05-08: qty 1

## 2017-05-08 MED ORDER — LIDOCAINE HCL (CARDIAC) 20 MG/ML IV SOLN
INTRAVENOUS | Status: DC | PRN
  Administered 2017-05-08: 40 mg via INTRAVENOUS

## 2017-05-08 MED ORDER — DEXTROSE-NACL 5-0.9 % IV SOLN
INTRAVENOUS | Status: DC
  Administered 2017-05-08: 19:00:00 via INTRAVENOUS

## 2017-05-08 MED ORDER — IRBESARTAN 150 MG PO TABS
150.0000 mg | ORAL_TABLET | Freq: Every day | ORAL | Status: DC
  Administered 2017-05-09: 150 mg via ORAL
  Filled 2017-05-08: qty 1

## 2017-05-08 MED ORDER — DEXAMETHASONE SODIUM PHOSPHATE 10 MG/ML IJ SOLN
INTRAMUSCULAR | Status: AC
  Filled 2017-05-08: qty 1

## 2017-05-08 MED ORDER — KETOROLAC TROMETHAMINE 30 MG/ML IJ SOLN
INTRAMUSCULAR | Status: AC
  Filled 2017-05-08: qty 1

## 2017-05-08 MED ORDER — VALSARTAN-HYDROCHLOROTHIAZIDE 160-25 MG PO TABS: 1.0000 | ORAL_TABLET | Freq: Every day | ORAL | Status: DC

## 2017-05-08 MED ORDER — SCOPOLAMINE 1 MG/3DAYS TD PT72
MEDICATED_PATCH | TRANSDERMAL | Status: AC
  Administered 2017-05-08: 1.5 mg via TRANSDERMAL
  Filled 2017-05-08: qty 1

## 2017-05-08 MED ORDER — MIDAZOLAM HCL 5 MG/5ML IJ SOLN
INTRAMUSCULAR | Status: DC | PRN
  Administered 2017-05-08: 2 mg via INTRAVENOUS

## 2017-05-08 MED ORDER — NEOSTIGMINE METHYLSULFATE 10 MG/10ML IV SOLN
INTRAVENOUS | Status: AC
  Filled 2017-05-08: qty 1

## 2017-05-08 MED ORDER — PROMETHAZINE HCL 25 MG/ML IJ SOLN
12.5000 mg | Freq: Four times a day (QID) | INTRAMUSCULAR | Status: DC | PRN
  Administered 2017-05-08: 12.5 mg via INTRAVENOUS
  Filled 2017-05-08: qty 1

## 2017-05-08 MED ORDER — KETOROLAC TROMETHAMINE 30 MG/ML IJ SOLN
30.0000 mg | Freq: Four times a day (QID) | INTRAMUSCULAR | Status: DC
  Administered 2017-05-08: 30 mg via INTRAVENOUS

## 2017-05-08 MED ORDER — LACTATED RINGERS IV SOLN
INTRAVENOUS | Status: DC
  Administered 2017-05-08: 10:00:00 via INTRAVENOUS

## 2017-05-08 MED ORDER — ONDANSETRON HCL 4 MG/2ML IJ SOLN
INTRAMUSCULAR | Status: DC | PRN
  Administered 2017-05-08: 4 mg via INTRAVENOUS

## 2017-05-08 MED ORDER — LIDOCAINE HCL (CARDIAC) 20 MG/ML IV SOLN
INTRAVENOUS | Status: AC
  Filled 2017-05-08: qty 5

## 2017-05-08 MED ORDER — HYDROMORPHONE HCL 1 MG/ML IJ SOLN
INTRAMUSCULAR | Status: AC
  Administered 2017-05-08: 1 mg
  Filled 2017-05-08: qty 1

## 2017-05-08 MED ORDER — BUPROPION HCL ER (XL) 150 MG PO TB24
150.0000 mg | ORAL_TABLET | Freq: Every day | ORAL | Status: DC
  Administered 2017-05-09: 150 mg via ORAL
  Filled 2017-05-08 (×2): qty 1

## 2017-05-08 MED ORDER — ONDANSETRON HCL 4 MG/2ML IJ SOLN
INTRAMUSCULAR | Status: AC
  Filled 2017-05-08: qty 2

## 2017-05-08 MED ORDER — LACTATED RINGERS IV SOLN
INTRAVENOUS | Status: DC | PRN
  Administered 2017-05-08 (×3): via INTRAVENOUS

## 2017-05-08 MED ORDER — PROPOFOL 10 MG/ML IV BOLUS
INTRAVENOUS | Status: DC | PRN
  Administered 2017-05-08: 150 mg via INTRAVENOUS
  Administered 2017-05-08: 50 mg via INTRAVENOUS

## 2017-05-08 MED ORDER — HYDROMORPHONE HCL 1 MG/ML IJ SOLN
1.0000 mg | INTRAMUSCULAR | Status: DC | PRN
  Administered 2017-05-08 – 2017-05-09 (×3): 1 mg via INTRAVENOUS
  Filled 2017-05-08 (×4): qty 1

## 2017-05-08 MED ORDER — GLYCOPYRROLATE 0.2 MG/ML IJ SOLN
INTRAMUSCULAR | Status: AC
  Filled 2017-05-08: qty 4

## 2017-05-08 MED ORDER — SUGAMMADEX SODIUM 200 MG/2ML IV SOLN
INTRAVENOUS | Status: AC
  Filled 2017-05-08: qty 2

## 2017-05-08 MED ORDER — PROMETHAZINE HCL 25 MG/ML IJ SOLN: 6.2500 mg | INTRAMUSCULAR | Status: DC | PRN

## 2017-05-08 MED ORDER — SODIUM CHLORIDE 0.9 % IV SOLN
INTRAVENOUS | Status: DC
  Administered 2017-05-08: 07:00:00 via INTRAVENOUS

## 2017-05-08 MED ORDER — ROCURONIUM BROMIDE 100 MG/10ML IV SOLN
INTRAVENOUS | Status: DC | PRN
  Administered 2017-05-08: 10 mg via INTRAVENOUS
  Administered 2017-05-08: 40 mg via INTRAVENOUS

## 2017-05-08 MED ORDER — DEXAMETHASONE SODIUM PHOSPHATE 10 MG/ML IJ SOLN
INTRAMUSCULAR | Status: DC | PRN
  Administered 2017-05-08: 10 mg via INTRAVENOUS

## 2017-05-08 MED ORDER — SCOPOLAMINE 1 MG/3DAYS TD PT72
1.0000 | MEDICATED_PATCH | Freq: Once | TRANSDERMAL | Status: DC
  Administered 2017-05-08: 1.5 mg via TRANSDERMAL

## 2017-05-08 SURGICAL SUPPLY — 34 items
BLADE SURG 10 STRL SS (BLADE) IMPLANT
CANISTER SUCT 3000ML PPV (MISCELLANEOUS) ×2 IMPLANT
CLOTH BEACON ORANGE TIMEOUT ST (SAFETY) ×2 IMPLANT
CONT PATH 16OZ SNAP LID 3702 (MISCELLANEOUS) ×2 IMPLANT
DERMABOND ADVANCED (GAUZE/BANDAGES/DRESSINGS) ×1
DERMABOND ADVANCED .7 DNX12 (GAUZE/BANDAGES/DRESSINGS) ×1 IMPLANT
DRAPE WARM FLUID 44X44 (DRAPE) IMPLANT
DURAPREP 26ML APPLICATOR (WOUND CARE) ×2 IMPLANT
GAUZE SPONGE 4X4 16PLY XRAY LF (GAUZE/BANDAGES/DRESSINGS) ×2 IMPLANT
GLOVE BIO SURGEON STRL SZ 6.5 (GLOVE) ×2 IMPLANT
GLOVE BIOGEL PI IND STRL 7.0 (GLOVE) ×2 IMPLANT
GLOVE BIOGEL PI INDICATOR 7.0 (GLOVE) ×2
GOWN STRL REUS W/TWL LRG LVL3 (GOWN DISPOSABLE) ×6 IMPLANT
HEMOSTAT SURGICEL 4X8 (HEMOSTASIS) IMPLANT
NS IRRIG 1000ML POUR BTL (IV SOLUTION) ×4 IMPLANT
PACK ABDOMINAL GYN (CUSTOM PROCEDURE TRAY) ×2 IMPLANT
PAD OB MATERNITY 4.3X12.25 (PERSONAL CARE ITEMS) ×2 IMPLANT
PROTECTOR NERVE ULNAR (MISCELLANEOUS) ×2 IMPLANT
SPONGE LAP 18X18 X RAY DECT (DISPOSABLE) IMPLANT
STAPLER VISISTAT 35W (STAPLE) IMPLANT
SUT CHROMIC 2 0 TIES 18 (SUTURE) IMPLANT
SUT MON AB 2-0 CT1 27 (SUTURE) ×2 IMPLANT
SUT MON AB-0 CT1 36 (SUTURE) IMPLANT
SUT PDS AB 0 CTX 60 (SUTURE) ×4 IMPLANT
SUT PLAIN 2 0 (SUTURE)
SUT PLAIN 2 0 XLH (SUTURE) ×2 IMPLANT
SUT PLAIN ABS 2-0 54XMFL TIE (SUTURE) IMPLANT
SUT VIC AB 0 CT1 18XCR BRD8 (SUTURE) IMPLANT
SUT VIC AB 0 CT1 27 (SUTURE) ×1
SUT VIC AB 0 CT1 27XBRD ANBCTR (SUTURE) ×1 IMPLANT
SUT VIC AB 0 CT1 8-18 (SUTURE)
SUT VICRYL 0 TIES 12 18 (SUTURE) ×2 IMPLANT
TOWEL OR 17X24 6PK STRL BLUE (TOWEL DISPOSABLE) ×4 IMPLANT
TRAY FOLEY CATH SILVER 14FR (SET/KITS/TRAYS/PACK) ×2 IMPLANT

## 2017-05-08 NOTE — Progress Notes (Signed)
Day of Surgery Procedure(s) (LRB): LAPAROTOMY with excision of infarcted torsed ovary (Left)  Subjective: Patient reports nausea.  Pain controlled.  No CP or SOB    Objective: I have reviewed patient's vital signs, intake and output, medications and labs.  Gen - NAD Abd - soft, NT  Bandage clean Ext - NT  Assessment: s/p Procedure(s) with comments: LAPAROTOMY with excision of infarcted torsed ovary (Left) - ok to schedule here per Chassity: stable  Plan: Advance diet Encourage ambulation Advance to PO medication  Phenergan for nausea  LOS: 0 days    [name] 05/08/2017, 4:56 PM

## 2017-05-08 NOTE — Progress Notes (Signed)
r/b/a discussed, questions answered, informed consent No changes to H&P

## 2017-05-08 NOTE — Anesthesia Postprocedure Evaluation (Addendum)
Anesthesia Post Note  Patient: [name]  Procedure(s) Performed: Procedure(s) (LRB): LAPAROTOMY with excision of infarcted torsed ovary (Left)  Patient location during evaluation: PACU Anesthesia Type: General Level of consciousness: sedated Pain management: pain level controlled Vital Signs Assessment: post-procedure vital signs reviewed and stable Respiratory status: spontaneous breathing and respiratory function stable Cardiovascular status: stable Anesthetic complications: no        Last Vitals:  Vitals:   05/08/17 1000 05/08/17 1015  BP:  106/63  Pulse:  75  Resp: 10 14  Temp:  36.4 C    Last Pain:  Vitals:   05/08/17 1015  TempSrc:   PainSc: 4    Pain Goal: Patients Stated Pain Goal: 3 (05/08/17 1015)               Prentiss Hammett DANIEL

## 2017-05-08 NOTE — Anesthesia Procedure Notes (Signed)
Procedure Name: Intubation Date/Time: 05/08/2017 7:33 AM Performed by: Ignacia Bayley Pre-anesthesia Checklist: Patient identified, Emergency Drugs available, Suction available and Patient being monitored Patient Re-evaluated:Patient Re-evaluated prior to inductionOxygen Delivery Method: Circle system utilized Preoxygenation: Pre-oxygenation with 100% oxygen Intubation Type: IV induction Ventilation: Mask ventilation without difficulty Laryngoscope Size: Miller and 2 Grade View: Grade I Tube type: Oral Number of attempts: 1 Airway Equipment and Method: Stylet Placement Confirmation: ETT inserted through vocal cords under direct vision,  positive ETCO2 and breath sounds checked- equal and bilateral Secured at: 21 cm Tube secured with: Tape Dental Injury: Teeth and Oropharynx as per pre-operative assessment

## 2017-05-08 NOTE — Transfer of Care (Signed)
Immediate Anesthesia Transfer of Care Note  Patient: [name]  Procedure(s) Performed: Procedure(s) with comments: LAPAROTOMY with excision of infarcted torsed ovary (Left) - ok to schedule here per Chassity  Patient Location: PACU  Anesthesia Type:General  Level of Consciousness: awake  Airway & Oxygen Therapy: Patient Spontanous Breathing and Patient connected to nasal cannula oxygen  Post-op Assessment: Report given to RN and Post -op Vital signs reviewed and stable  Post vital signs: stable  Last Vitals:  Vitals:   05/08/17 0612  BP: 124/78  Pulse: 98  Resp: 18  Temp: 37.4 C    Last Pain:  Vitals:   05/08/17 0612  TempSrc: Oral  PainSc: 0-No pain      Patients Stated Pain Goal: 3 (18/34/37 3578)  Complications: No apparent anesthesia complications

## 2017-05-08 NOTE — Op Note (Signed)
NAMERogelio Seen NO.:  [id number]  MEDICAL RECORD NO.:  [id number]  LOCATION:                                 FACILITY:  PHYSICIAN:  Marylynn Pearson, MD         DATE OF BIRTH:  DATE OF PROCEDURE:  05/08/2017 DATE OF DISCHARGE:                              OPERATIVE REPORT   PREOPERATIVE DIAGNOSIS:  Infarcted torsed left ovary.  POSTOPERATIVE DIAGNOSIS:  Infarcted torsed left ovary.  PROCEDURE: 1. Laparotomy with excision of left ovary. 2. Lysis of adhesions.  SURGEON:  Marylynn Pearson, MD.  ASSISTANTMatthew Saras.  BLOOD LOSS:  350 mL.  COMPLICATIONS:  None.  CONDITION:  Stable to recovery room.  DESCRIPTION OF PROCEDURE:  The patient was taken to the operating room where she was placed in the supine position.  She was given general anesthesia, prepped and draped in sterile fashion and a Foley catheter was inserted sterilely.  Laparotomy incision was made using her previous incision from her abdominoplasty per the patient's request.  Incision was extended down to the fascia.  The fascia was incised in the midline and this was extended laterally using curved Mayo scissors.  Peritoneum was entered sharply.  Evaluation noted a large 8.5 cm mass in the pelvis that was adherent to the vaginal cuff with thin adhesions.  Ovary appeared somewhat necrotic.  Balfour retractor was placed.  The patient was placed in Trendelenburg and the bowel was packed with moist lap sponge.  The ovary was grasped with a Kelly clamp and dissected free from the vaginal cuff using sharp and blunt dissection.  The infundibulopelvic ligament was then clamped and ovary was excised and passed off to be sent to pathology. Small normal appearing ovarian remnant left at pedicle.  IP ligament was doubly sutured using Vicryl.  Excellent hemostasis was noted.  The pelvis was copiously irrigated and a few small pieces of necrotic ovarian tissue were removed from the pelvis.  Vaginal cuff  was noted to be hemostatic.  Left pedicle was hemostatic and the right ovary was normal in appearance.  All bowel packs and retractors were then removed.  The peritoneum was reapproximated with Monocryl.  The fascia was closed with PDS and the skin was closed with Vicryl.  Sponge, lap, needle, and instrument counts were correct x2.  She was extubated and taken to the recovery room in stable condition.     Marylynn Pearson, MD     GA/MEDQ  D:  05/08/2017  T:  05/08/2017  Job:  [id number]

## 2017-05-09 ENCOUNTER — Encounter (HOSPITAL_COMMUNITY): Payer: Self-pay | Admitting: Obstetrics and Gynecology

## 2017-05-09 ENCOUNTER — Encounter: Payer: Self-pay | Admitting: Cardiology

## 2017-05-09 LAB — CUP PACEART REMOTE DEVICE CHECK
Battery Impedance: 1330 Ohm
Brady Statistic AP VP Percent: 1 %
Brady Statistic AP VS Percent: 0 %
Brady Statistic AS VS Percent: 0 %
Date Time Interrogation Session: 20180509164529
Implantable Lead Implant Date: 19990617
Implantable Lead Location: 753859
Implantable Pulse Generator Implant Date: 20090827
Lead Channel Impedance Value: 363 Ohm
Lead Channel Impedance Value: 796 Ohm
Lead Channel Pacing Threshold Amplitude: 1 V
Lead Channel Pacing Threshold Pulse Width: 0.4 ms
Lead Channel Sensing Intrinsic Amplitude: 0.7 mV
MDC IDC LEAD IMPLANT DT: 19990617
MDC IDC LEAD LOCATION: 753860
MDC IDC MSMT BATTERY REMAINING LONGEVITY: 50 mo
MDC IDC MSMT BATTERY VOLTAGE: 2.77 V
MDC IDC MSMT LEADCHNL RV PACING THRESHOLD AMPLITUDE: 1.25 V
MDC IDC MSMT LEADCHNL RV PACING THRESHOLD PULSEWIDTH: 0.4 ms
MDC IDC SET LEADCHNL RA PACING AMPLITUDE: 2 V
MDC IDC SET LEADCHNL RV PACING AMPLITUDE: 2.5 V
MDC IDC SET LEADCHNL RV PACING PULSEWIDTH: 0.4 ms
MDC IDC SET LEADCHNL RV SENSING SENSITIVITY: 2 mV
MDC IDC STAT BRADY AS VP PERCENT: 99 %

## 2017-05-09 LAB — CBC
HCT: 30.6 % — ABNORMAL LOW (ref 36.0–46.0)
HEMOGLOBIN: 10.5 g/dL — AB (ref 12.0–15.0)
MCH: 31.6 pg (ref 26.0–34.0)
MCHC: 34.3 g/dL (ref 30.0–36.0)
MCV: 92.2 fL (ref 78.0–100.0)
PLATELETS: 259 10*3/uL (ref 150–400)
RBC: 3.32 MIL/uL — AB (ref 3.87–5.11)
RDW: 12.5 % (ref 11.5–15.5)
WBC: 9.6 10*3/uL (ref 4.0–10.5)

## 2017-05-09 MED ORDER — IBUPROFEN 600 MG PO TABS: 600.0000 mg | ORAL_TABLET | Freq: Four times a day (QID) | ORAL | 0 refills | Status: AC | PRN

## 2017-05-09 MED ORDER — OXYCODONE-ACETAMINOPHEN 5-325 MG PO TABS: 1.0000 | ORAL_TABLET | ORAL | 0 refills | Status: DC | PRN

## 2017-05-09 NOTE — Discharge Summary (Signed)
Physician Discharge Summary  Patient ID: [name] MRN: [id number] DOB/AGE: September 22, 1976 41 y.o.  Admit date: 05/08/2017 Discharge date: 05/09/2017  Admission Diagnoses: infarcted torsed ovary  Discharge Diagnoses:  Active Problems:   Torsed ovary   Discharged Condition: good  Hospital Course: pt was admitted for postoperative care.  Initially, her pain was controlled with IV pain meds and foley catheter was in place.  As she was able to tolerate PO intake, she was given oral pain medication.  She was able to ambulate and foley catheter was removed.  POD1, she was tolerating regular diet, ambulating and pain was well controlled.  Consults: None  Significant Diagnostic Studies: labs: cbc  Treatments: surgery: laparotomy with excision of left ovary  Discharge Exam: Blood pressure (!) 98/59, pulse 89, temperature 98.3 F (36.8 C), temperature source Oral, resp. rate 18, height 5' 0.5" (1.537 m), weight 169 lb (76.7 kg), last menstrual period 04/29/2014, SpO2 99 %. Gen - NAD Abd - soft, NT/ND Ext - NT, no edema Inc:  c/d/i  Disposition: 01-Home or Self Care  Discharge Instructions    Call MD for:  persistant nausea and vomiting    Complete by:  As directed    Call MD for:  redness, tenderness, or signs of infection (pain, swelling, redness, odor or green/yellow discharge around incision site)    Complete by:  As directed    Call MD for:  severe uncontrolled pain    Complete by:  As directed    Call MD for:  temperature >100.4    Complete by:  As directed    Diet - low sodium heart healthy    Complete by:  As directed    Increase activity slowly    Complete by:  As directed      Allergies as of 05/09/2017      Reactions   Adhesive [tape] Other (See Comments)   Blisters from steristrips      Medication List    TAKE these medications   buPROPion 150 MG 24 hr tablet Commonly known as:  WELLBUTRIN XL Take 150 mg by mouth daily.   ibuprofen 600 MG tablet Commonly  known as:  ADVIL Take 1 tablet (600 mg total) by mouth every 6 (six) hours as needed.   oxyCODONE-acetaminophen 5-325 MG tablet Commonly known as:  PERCOCET/ROXICET Take 1-2 tablets by mouth every 4 (four) hours as needed for severe pain (moderate to severe pain (when tolerating fluids)).   valsartan-hydrochlorothiazide 160-25 MG tablet Commonly known as:  DIOVAN-HCT Take 1 tablet by mouth daily.      Follow-up Information    Marylynn Pearson, MD. Schedule an appointment as soon as possible for a visit in 2 week(s).   Specialty:  Obstetrics and Gynecology Contact information: [address], [place] [address] [phone]           Signed: Marylynn Pearson 05/09/2017, 7:49 AM

## 2017-05-14 ENCOUNTER — Ambulatory Visit: Payer: 59 | Attending: Gynecologic Oncology | Admitting: Gynecologic Oncology

## 2017-05-14 ENCOUNTER — Encounter: Payer: Self-pay | Admitting: Gynecologic Oncology

## 2017-05-14 VITALS — BP 134/81 | HR 86 | Temp 98.8°F | Resp 20 | Ht 60.5 in | Wt 168.5 lb

## 2017-05-14 DIAGNOSIS — C562 Malignant neoplasm of left ovary: Secondary | ICD-10-CM | POA: Insufficient documentation

## 2017-05-14 DIAGNOSIS — F419 Anxiety disorder, unspecified: Secondary | ICD-10-CM | POA: Insufficient documentation

## 2017-05-14 DIAGNOSIS — Z950 Presence of cardiac pacemaker: Secondary | ICD-10-CM | POA: Diagnosis not present

## 2017-05-14 DIAGNOSIS — Z79899 Other long term (current) drug therapy: Secondary | ICD-10-CM | POA: Insufficient documentation

## 2017-05-14 DIAGNOSIS — C569 Malignant neoplasm of unspecified ovary: Secondary | ICD-10-CM | POA: Diagnosis present

## 2017-05-14 DIAGNOSIS — I10 Essential (primary) hypertension: Secondary | ICD-10-CM | POA: Diagnosis not present

## 2017-05-14 NOTE — Patient Instructions (Signed)
Plan to have surgery at [facility] with Dr. Alycia Rossetti on June 03, 2017.  Your pre-operative appointment will be at [facility] at May 20, 2017 at 11:45am.  You will be scheduled for a robotic staging procedure.

## 2017-05-14 NOTE — Progress Notes (Signed)
Consult Note: Gyn-Onc  [name] 41 y.o. female  CC:  Chief Complaint  Patient presents with  . Ovarian Cancer    HPI: Patient is seen today in consultation at the request of Dr. Marylynn Pearson. Cardiologist Dr. Cristopher Peru.  Patient is a very pleasant 41 year old gravida 5 para 4 who underwent a vaginal hysterectomy 2015 secondary to irregular bleeding. She has always had normal Pap smears. She states that about a month ago she had sharp pain voiding and she really felt that it could be related to UTI but felt a little bit different. She was seen by her primary care physician who told her that her urine was clear and exam was performed that showed a yeast infection. Her symptoms resolved but she still felt a little bit tender. About 10 days ago she her husband had had intercourse after she got back into town in about 20 minutes later she had severe pain that she described as being "like labor pains". She went to the top of the pain.eased off and she was able to go back to sleep. The next day which was a Monday she felt some more. The following Tuesday she was seen by Dr. Julien Girt. The CT and ultrasound were performed.  FINDINGS: Lower chest: No pulmonary nodules. No visible pleural or pericardial effusion. Hepatobiliary: Normal hepatic size and contours without focal liver lesion. No perihepatic ascites. No intra- or extrahepatic biliary dilatation. Status post cholecystectomy Adrenals/Urinary Tract: Normal adrenal glands. No hydronephrosis or solid renal mass. Bilobed left renal cyst measures up to 7.7 cm. Vascular/Lymphatic: Normal course and caliber of the major abdominal vessels. No abdominal or pelvic adenopathy.  Reproductive: The left ovary is massively enlarged with heterogeneous attenuation. Overall, it measures 8.5 x 8.1 cm. There is adjacent free fluid. Status post hysterectomy.  Musculoskeletal: No lytic or blastic osseous lesion. Normal visualized extrathoracic and  extraperitoneal soft tissues. IMPRESSION: Enlarged left ovary with areas of decreased enhancement suggesting ischemia. This appearance is highly concerning for ovarian torsion, though CT is less effective at evaluating the vasculature than is Doppler ultrasound.  It was felt that she most likely had a torsion and she underwent exploratory laparotomy with excision of the left ovary and lysis of adhesions on 05/08/2017. Operative findings included an 8.5 cm mass in the pelvis that was moderately adherent to the vaginal cuff. The ovary appears somewhat necrotic. There was a comment that the right ovary was normal in appearance.  Pathology revealed: Ovary, left - ENDOMETRIOID ADENOCARCINOMA. - SEE ONCOLOGY TABLE BELOW. 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 She is taking narcotic pain medication at night. She had some diarrhea today but she thinks that secondary to her being very nervous about what we would tell her today. She's otherwise doing fairly well.  Current Meds:  Outpatient Encounter Prescriptions as of 05/14/2017  Medication Sig  . buPROPion (WELLBUTRIN XL) 150 MG 24 hr tablet Take 150 mg by mouth daily.  Marland Kitchen ibuprofen (ADVIL,MOTRIN) 600 MG tablet Take 1 tablet (600 mg total) by mouth every 6 (six) hours as needed.  Marland Kitchen oxyCODONE-acetaminophen (PERCOCET/ROXICET) 5-325 MG tablet Take 1-2 tablets by mouth every 4 (four) hours as needed for severe pain (  moderate to severe pain (when tolerating fluids)).  . valsartan-hydrochlorothiazide (DIOVAN-HCT) 160-25 MG tablet Take 1  tablet by mouth daily.  . promethazine (PHENERGAN) 12.5 MG tablet    No facility-administered encounter medications on file as of 05/14/2017.     Allergy:  Allergies  Allergen Reactions  . Adhesive [Tape] Other (See Comments)    Blisters from steristrips    Social Hx:   Social History   Social History  . Marital status: Married    Spouse name: N/A  . Number of children: N/A  . Years of education: N/A   Occupational History  . Not on file.   Social History Main Topics  . Smoking status: Never Smoker  . Smokeless tobacco: Never Used  . Alcohol use Yes     Comment: 2-or more glases daily wine  . Drug use: No  . Sexual activity: Yes    Birth control/ protection: Surgical   Other Topics Concern  . Not on file   Social History Narrative  . No narrative on file    Past Surgical Hx:  Past Surgical History:  Procedure Laterality Date  . ABDOMINOPLASTY    . BREAST SURGERY     augmentation  . [place], 2006   x 2  . CHOLECYSTECTOMY    . DILATION AND CURETTAGE OF UTERUS  08/2006  . INSERT / REPLACE / [place]  1999, 2009   Currently has pacemaker was replaced in 2009  . LAPAROSCOPIC ASSISTED VAGINAL HYSTERECTOMY N/A 05/05/2014   Procedure: LAPAROSCOPIC ASSISTED VAGINAL HYSTERECTOMY;  Surgeon: Marylynn Pearson, MD;  Location: [facility];  Service: Gynecology;  Laterality: N/A;  . LAPAROTOMY Left 05/08/2017   Procedure: LAPAROTOMY with excision of infarcted torsed ovary;  Surgeon: Marylynn Pearson, MD;  Location: [facility];  Service: Gynecology;  Laterality: Left;  ok to schedule here per [place]  . TUBAL LIGATION    . WISDOM TOOTH EXTRACTION  1994    Past Medical Hx:  Past Medical History:  Diagnosis Date  . Anxiety   . 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 Hypertension   . Presence of permanent cardiac pacemaker    Medtroni - was replaced in 2009 -complete heart block  . Seasonal allergies   . SVD (spontaneous  vaginal delivery)    x 2    Oncology Hx:    Ovarian cancer on left [facility])   05/08/2017 Initial Diagnosis    Ovarian cancer on left [facility])       Family Hx:  Family History  Problem Relation Age of Onset  . Hypertension Mother   . Cancer Mother 25       melanoma  . Cancer Father 55       prostate  . Cancer Maternal Grandmother 70       pancreatic  . Cancer Paternal Grandfather 9       lymphoma    Vitals:  Blood pressure 134/81, pulse 86, temperature 98.8 F (37.1 C), resp. rate 20, height 5' 0.5" (1.537 m), weight 168 lb 8 oz (76.4 kg), last menstrual period 04/29/2014.  Physical Exam:  Well-nourished well-developed female in no acute distress.  Neck: Supple, no lymphadenopathy, no thyromegaly.  Lungs: Clear to auscultation bilaterally.  Cardiac: Regular rate and rhythm.  Abdomen: Well-healing Pfannenstiel incision. Prior transverse incision from abdominoplasty. Periumbilical incisions well-healed. There is no evidence of any incisional hernias. Abdomen is soft and nontender with the exception of right along the  incision. There is no fluid wave.  Groins: No lymphadenopathy.  Extremities: No edema  Pelvic: Deferred  Assessment/Plan:  41 year old with at least a stage IC grade 1 endometrioid adenocarcinoma status post left salpingo-oophorectomy at the time of protrusion. She has a stage IC tumor based on surface involvement. There are no abdominal pelvic washings were obtained. I met with the patient, her husband, her mother, and her father today. I am concerned that they could have a BRCA mutation in the family is a maternal grandmother had pancreatic cancer, the patient's mother had melanoma and now the patient has an endometrioid ovarian cancer. I discussed with them that we need to proceed with surgical staging. He believed that we can most likely, persistent minimally invasive form. We could proceed with a robotic right salpingo-oophorectomy, pelvic and aortic  lymphadenectomy, staging biopsies, washings and omentectomy. We will obtain a CA-125 which she comes in for preop visit. I would like to be falsely elevated today secondary to the recent surgery. The patient and her husband both state that a portion of her left ovary is still in situ. I do not see that from the operative note but I did tell them that if there's any evidence of any ovarian pedicle remaining on the left that we would ensure to take all of it.  Because she has a stage IC disease based on pathology even if her staging is negative would be a role for adjuvant chemotherapy. We discussed this and very generic terms.  We also discussed the need to proceed with genetic testing. This can also be done after surgery. She would like for me to do her surgery. I do not have OR time here in an appropriate timeframe for her. Therefore, we'll move forward with her surgery at [facility] on 06/04/2007 18. She scheduled for preop visit at [facility] as well.  Her questions as well as those of her family were elicited in answer to their satisfaction.  It's been our pleasure to participate in the care of this very pleasant patient.  Greater than 1 hour face-to-face time was done with the patient and her family. Nancy Marus A., MD 05/14/2017, 4:42 PM

## 2017-05-20 DIAGNOSIS — Z01812 Encounter for preprocedural laboratory examination: Secondary | ICD-10-CM | POA: Diagnosis not present

## 2017-05-20 DIAGNOSIS — I442 Atrioventricular block, complete: Secondary | ICD-10-CM | POA: Diagnosis not present

## 2017-05-20 DIAGNOSIS — Z01810 Encounter for preprocedural cardiovascular examination: Secondary | ICD-10-CM | POA: Diagnosis not present

## 2017-05-20 DIAGNOSIS — Z01818 Encounter for other preprocedural examination: Secondary | ICD-10-CM | POA: Diagnosis not present

## 2017-05-20 DIAGNOSIS — I10 Essential (primary) hypertension: Secondary | ICD-10-CM | POA: Diagnosis not present

## 2017-05-31 NOTE — Addendum Note (Signed)
Addendum  created 05/31/17 0958 by Duane Boston, MD   Sign clinical note

## 2017-06-03 DIAGNOSIS — Z9889 Other specified postprocedural states: Secondary | ICD-10-CM | POA: Insufficient documentation

## 2017-06-03 DIAGNOSIS — C541 Malignant neoplasm of endometrium: Secondary | ICD-10-CM | POA: Diagnosis not present

## 2017-06-03 DIAGNOSIS — I10 Essential (primary) hypertension: Secondary | ICD-10-CM | POA: Diagnosis not present

## 2017-06-03 DIAGNOSIS — G8918 Other acute postprocedural pain: Secondary | ICD-10-CM | POA: Diagnosis not present

## 2017-06-03 DIAGNOSIS — C799 Secondary malignant neoplasm of unspecified site: Secondary | ICD-10-CM | POA: Diagnosis not present

## 2017-06-08 DIAGNOSIS — C189 Malignant neoplasm of colon, unspecified: Secondary | ICD-10-CM | POA: Insufficient documentation

## 2017-06-13 DIAGNOSIS — C269 Malignant neoplasm of ill-defined sites within the digestive system: Secondary | ICD-10-CM | POA: Diagnosis not present

## 2017-06-19 ENCOUNTER — Telehealth: Payer: Self-pay | Admitting: Gynecologic Oncology

## 2017-06-19 ENCOUNTER — Encounter: Payer: Self-pay | Admitting: Gynecologic Oncology

## 2017-06-19 NOTE — Telephone Encounter (Signed)
Left message for the patient about appointment to meet with Dr. Amedeo Plenty tomorrow at 9:45am at [facility].  Advised her that Dr. Alycia Rossetti wanted her to seen urgently and to please let us know if she is unable to make this appt.  Left address and phone number for GI office on message as well.  Advised to arrive at 9:30 and to call for any questions or concerns.  Also advised to please call and confirm that she would be going to this appt.

## 2017-06-19 NOTE — Progress Notes (Signed)
Patient's information faxed to [facility] at [phone] including notes from [facility] as well.

## 2017-06-19 NOTE — Telephone Encounter (Signed)
Patient [place] is at the beach and will be driving back tomorrow.  She states she can come back if she can have to colonoscopy and upper endo on Monday but if not then maybe an appt on Monday with colonoscopy on Tues.  Message sent to Dr. Alycia Rossetti about the above situation.

## 2017-06-20 ENCOUNTER — Telehealth: Payer: Self-pay | Admitting: *Deleted

## 2017-06-20 DIAGNOSIS — C799 Secondary malignant neoplasm of unspecified site: Secondary | ICD-10-CM | POA: Diagnosis not present

## 2017-06-20 NOTE — Telephone Encounter (Signed)
Called and left the patient a message to keep her appt on June 27th per Dr. Alycia Rossetti.

## 2017-06-24 ENCOUNTER — Other Ambulatory Visit: Payer: Self-pay | Admitting: Gynecologic Oncology

## 2017-06-24 DIAGNOSIS — D490 Neoplasm of unspecified behavior of digestive system: Secondary | ICD-10-CM | POA: Diagnosis not present

## 2017-06-24 DIAGNOSIS — K2930 Chronic superficial gastritis without bleeding: Secondary | ICD-10-CM | POA: Diagnosis not present

## 2017-06-24 DIAGNOSIS — K228 Other specified diseases of esophagus: Secondary | ICD-10-CM | POA: Diagnosis not present

## 2017-06-24 DIAGNOSIS — K3189 Other diseases of stomach and duodenum: Secondary | ICD-10-CM | POA: Diagnosis not present

## 2017-06-24 DIAGNOSIS — D126 Benign neoplasm of colon, unspecified: Secondary | ICD-10-CM | POA: Diagnosis not present

## 2017-06-24 DIAGNOSIS — C189 Malignant neoplasm of colon, unspecified: Secondary | ICD-10-CM | POA: Diagnosis not present

## 2017-06-24 DIAGNOSIS — C482 Malignant neoplasm of peritoneum, unspecified: Secondary | ICD-10-CM | POA: Diagnosis not present

## 2017-06-24 DIAGNOSIS — C269 Malignant neoplasm of ill-defined sites within the digestive system: Secondary | ICD-10-CM

## 2017-06-24 DIAGNOSIS — K219 Gastro-esophageal reflux disease without esophagitis: Secondary | ICD-10-CM | POA: Diagnosis not present

## 2017-06-24 DIAGNOSIS — C187 Malignant neoplasm of sigmoid colon: Secondary | ICD-10-CM | POA: Diagnosis not present

## 2017-06-25 ENCOUNTER — Other Ambulatory Visit (HOSPITAL_BASED_OUTPATIENT_CLINIC_OR_DEPARTMENT_OTHER): Payer: 59

## 2017-06-25 ENCOUNTER — Encounter: Payer: Self-pay | Admitting: Gynecologic Oncology

## 2017-06-25 ENCOUNTER — Ambulatory Visit: Payer: 59 | Attending: Gynecologic Oncology | Admitting: Gynecologic Oncology

## 2017-06-25 ENCOUNTER — Other Ambulatory Visit: Payer: Self-pay | Admitting: Gynecologic Oncology

## 2017-06-25 VITALS — BP 139/86 | HR 84 | Temp 99.0°F | Resp 18

## 2017-06-25 DIAGNOSIS — Z9049 Acquired absence of other specified parts of digestive tract: Secondary | ICD-10-CM | POA: Diagnosis not present

## 2017-06-25 DIAGNOSIS — I442 Atrioventricular block, complete: Secondary | ICD-10-CM | POA: Insufficient documentation

## 2017-06-25 DIAGNOSIS — Z8249 Family history of ischemic heart disease and other diseases of the circulatory system: Secondary | ICD-10-CM | POA: Insufficient documentation

## 2017-06-25 DIAGNOSIS — C186 Malignant neoplasm of descending colon: Secondary | ICD-10-CM

## 2017-06-25 DIAGNOSIS — Z8042 Family history of malignant neoplasm of prostate: Secondary | ICD-10-CM | POA: Insufficient documentation

## 2017-06-25 DIAGNOSIS — Z90710 Acquired absence of both cervix and uterus: Secondary | ICD-10-CM | POA: Insufficient documentation

## 2017-06-25 DIAGNOSIS — Z950 Presence of cardiac pacemaker: Secondary | ICD-10-CM | POA: Diagnosis not present

## 2017-06-25 DIAGNOSIS — I10 Essential (primary) hypertension: Secondary | ICD-10-CM | POA: Diagnosis not present

## 2017-06-25 DIAGNOSIS — Z807 Family history of other malignant neoplasms of lymphoid, hematopoietic and related tissues: Secondary | ICD-10-CM | POA: Diagnosis not present

## 2017-06-25 DIAGNOSIS — Z888 Allergy status to other drugs, medicaments and biological substances status: Secondary | ICD-10-CM | POA: Diagnosis not present

## 2017-06-25 DIAGNOSIS — F419 Anxiety disorder, unspecified: Secondary | ICD-10-CM | POA: Diagnosis not present

## 2017-06-25 DIAGNOSIS — C569 Malignant neoplasm of unspecified ovary: Secondary | ICD-10-CM | POA: Diagnosis not present

## 2017-06-25 DIAGNOSIS — C7960 Secondary malignant neoplasm of unspecified ovary: Secondary | ICD-10-CM | POA: Diagnosis not present

## 2017-06-25 DIAGNOSIS — N951 Menopausal and female climacteric states: Secondary | ICD-10-CM

## 2017-06-25 DIAGNOSIS — C801 Malignant (primary) neoplasm, unspecified: Secondary | ICD-10-CM | POA: Diagnosis not present

## 2017-06-25 DIAGNOSIS — C189 Malignant neoplasm of colon, unspecified: Secondary | ICD-10-CM | POA: Diagnosis not present

## 2017-06-25 DIAGNOSIS — Z800 Family history of malignant neoplasm of digestive organs: Secondary | ICD-10-CM | POA: Insufficient documentation

## 2017-06-25 DIAGNOSIS — Z9889 Other specified postprocedural states: Secondary | ICD-10-CM

## 2017-06-25 DIAGNOSIS — R1900 Intra-abdominal and pelvic swelling, mass and lump, unspecified site: Secondary | ICD-10-CM | POA: Insufficient documentation

## 2017-06-25 DIAGNOSIS — Z79899 Other long term (current) drug therapy: Secondary | ICD-10-CM | POA: Insufficient documentation

## 2017-06-25 LAB — CEA (IN HOUSE-CHCC): CEA (CHCC-IN HOUSE): 9.53 ng/mL — AB (ref 0.00–5.00)

## 2017-06-25 MED ORDER — ESTRADIOL 1 MG PO TABS: 1.0000 mg | ORAL_TABLET | Freq: Every day | ORAL | 12 refills | Status: AC

## 2017-06-25 NOTE — Progress Notes (Signed)
Consult Note: Gyn-Onc  [name] 41 y.o. female  CC:  Chief Complaint  Patient presents with  . 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 Marylynn Johnston. Cardiologist Dr. Cristopher Johnston.  Patient is a very pleasant 41 year old gravida 5 para 4 who underwent a vaginal hysterectomy 2015 secondary to irregular bleeding. She has always had normal Pap smears. She states that about a month ago she had sharp pain voiding and she really felt that it could be related to UTI but felt a little bit different. She was seen by her primary care physician who told her that her urine was clear and exam was performed that showed a yeast infection. Her symptoms resolved but she still felt a little bit tender. About 10 days ago she her husband had had intercourse after she got back into town in about 20 minutes later she had severe pain that she described as being "like labor pains". She went to the top of the pain.eased off and she was able to go back to sleep. The next day which was a Monday she felt some more. The following Tuesday she was seen by Dr. Julien Johnston. The CT and ultrasound were performed.  FINDINGS: Lower chest: No pulmonary nodules. No visible pleural or pericardial effusion. Hepatobiliary: Normal hepatic size and contours without focal liver lesion. No perihepatic ascites. No intra- or extrahepatic biliary dilatation. Status post cholecystectomy Adrenals/Urinary Tract: Normal adrenal glands. No hydronephrosis or solid renal mass. Bilobed left renal cyst measures up to 7.7 cm. Vascular/Lymphatic: Normal course and caliber of the major abdominal vessels. No abdominal or pelvic adenopathy.  Reproductive: The left ovary is massively enlarged with heterogeneous attenuation. Overall, it measures 8.5 x 8.1 cm. There is adjacent free fluid. Status post hysterectomy.  Musculoskeletal: No lytic or blastic osseous lesion. Normal visualized  extrathoracic and extraperitoneal soft tissues. IMPRESSION: Enlarged left ovary with areas of decreased enhancement suggesting ischemia. This appearance is highly concerning for ovarian torsion, though CT is less effective at evaluating the vasculature than is Doppler ultrasound.  It was felt that she most likely had a torsion and she underwent exploratory laparotomy with excision of the left ovary and lysis of adhesions on 05/08/2017. Operative findings included an 8.5 cm mass in the pelvis that was moderately adherent to the vaginal cuff. The ovary appears somewhat necrotic. There was a comment that the right ovary was normal in appearance.  Pathology at [facility] was consistent with:  Diagnosis Ovary, left - ENDOMETRIOID ADENOCARCINOMA. - SEE ONCOLOGY TABLE BELOW.   06/03/2017 Surgery    Robotic-assisted ovarian cancer staging including left salpingectomy, rightsalpingo-oophorectomy, left pelvic lymphadenectomy, peritoneal biopsies, omnetectomy     Findings: On EUA, no masses or nodularity apparent. No ascites noted on intra-abdominal entry. The liver edge, diaphragm and stomach all smooth. Omentum normal appearing without nodules. Small and large bowel normal appearing, including appendix. Left fallopian tube in situ and somewhat tortuous but otherwise normal appearing. No enlarged left pelvic lymph nodes, left para-aortic lymph nodes unable to be accessed due to small bowel mesentery and patient's body habitus. White nodules, either what appeared to be miliary disease versus small endometriosis implants (all <0.3cm), were noted on the left pelvic sidewall peritoneum, bladder peritoneum, posterior cul-de-sac and right sidewall peritoneum. Normal appearing right fallopian tube and ovary. Two left ureters, one right ureter seen peristalsing.   R0 resection at the end of surgery. All miliary disease on the peritoneum was  either excised or cauterized.  Pathology: Final Diagnosis  A.  Pelvic washing - Atypical cells present, favor involvement by the patient's adenocarcinoma (see comment) - Reactive mesothelial cells   Final Diagnosis  A: Left pelvic sidewall, biopsy  - Involved by metastatic moderately differentiated adenocarcinoma - See part C and diagnosis comment  B: Lymph node, left pelvic, regional resection  - Ten lymph nodes with no metastatic carcinoma identified (0/10)  C: Fallopian tube, left, salpingectomy  - Fallopian tube wall and paratubal tissue involved by metastatic moderately differentiated adenocarcinoma, with immunohistochemical staining suggestive of colonic or lower gastrointestinal primary - Size of largest tumor deposit 0.6 cm - Paratubal cyst - See diagnosis comment  D: Left abdominal wall, biopsy  - Involved by metastatic moderately differentiated adenocarcinoma - See part C and diagnosis comment  E: Cul-de-sac, biopsy  - Cauterized fibroadipose tissue with focal hemosiderin laden macrophages - No malignancy identified   F: Peritoneal wall, biopsy - Involved by metastatic moderately differentiated adenocarcinoma - See part C and diagnosis comment  G: Ovary and fallopian tube, right, salpingo-oophorectomy  - Ovary with hemorrhagic cystic corpus luteum and physiologic changes - Benign fallopian tube - Entirely submitted for histologic evaluation with no involvement by malignancy identified   H: Omentum, omentectomy  - Fragments of benign fibroadipose tissue - No carcinoma identified     Final Diagnosis  (Outside case: [id number] 1E, 1 block, 05/08/2017)  A. "Ovary, left, oophorectomy" - Moderately differentiated adenocarcinoma, with morphology and immunostaining most consistent with gastrointestinal origin (see microscopic exam and comment) - Fragmented specimen, 14 cm in aggregate per outside pathology report - No background uninvolved ovarian tissue identified in block received for review  Electronically signed by  Alejandra Laine, MD on 06/17/2017 at 1445   Stage/Disposition: [name] is a 41 y.o. woman with Stage IVC adenocarcinoma of GI origin. IHC negative for Lynch syndrome; awaiting MSI testing. Would disposition to colonoscopy/EGD followed by referral to Medical Oncology. IHC/MSI testing reportedly normal.  She underwent surgical staging at [facility] on June 5. The pathology at [facility] was most consistent with a metastatic colorectal cancer. Her outside slides were reviewed at [facility] and confirm the diagnosis.   MSI reporting:  Results of immunohistochemical stains for mismatch repair protein expression performed on a section of tumor (block C1):  MLH-1: Nuclear positivity in adenocarcinoma MSH-2: Nuclear positivity in adenocarcinoma MSH-6: Nuclear positivity in adenocarcinoma PMS-2: Nuclear positivity in adenocarcinoma  This represents the normal phenotype and does not support a diagnosis of HNPCC / Lynch syndrome. Tissue was also sent to the [facility] [phone]) where microsatellite instability testing will be performed. Results will be issued in a separate report.          She had a colonoscopy yesterday. I spoke to the gastroenterologist and she has a non-circumferential tumor 30 cm above the anus. It was biopsied and tattooed with [place] ink. There was another smaller sessile polyp that was also removed. She is scheduled to be seen by [facility] medical oncology GI service on June 28. She also appears to have an appointment at [facility] on July 3 with oncology. She comes in today for brief postoperative check with me.  She's overall doing fairly well. She is having vasomotor symptoms and we had discussed that this was ovarian cancer especially those endometrioid most likely not proceed with any hormone replacement therapy. However in the setting of it being a metastatic colon cancer think it is reasonable. We will also try to get her  PET scan scheduled that she has requested. We will  working through her insurance preauthorization however she states that she and her husband will pad a pocket for it so currently her PET scan is being scheduled. We also discussed getting a CEA. She's not having any bleeding or pain. She did have a mild superficial separation of her umbilical port.   Current Meds:  Outpatient Encounter Prescriptions as of 06/25/2017  Medication Sig  . ALPRAZolam (XANAX) 0.25 MG tablet   . buPROPion (WELLBUTRIN XL) 150 MG 24 hr tablet Take 150 mg by mouth daily.  Marland Kitchen ibuprofen (ADVIL,MOTRIN) 600 MG tablet Take 1 tablet (600 mg total) by mouth every 6 (six) hours as needed.  . promethazine (PHENERGAN) 12.5 MG tablet   . valsartan-hydrochlorothiazide (DIOVAN-HCT) 160-25 MG tablet Take 1 tablet by mouth daily.  . [DISCONTINUED] oxyCODONE-acetaminophen (PERCOCET/ROXICET) 5-325 MG tablet Take 1-2 tablets by mouth every 4 (four) hours as needed for severe pain (moderate to severe pain (when tolerating fluids)).   No facility-administered encounter medications on file as of 06/25/2017.     Allergy:  Allergies  Allergen Reactions  . Adhesive [Tape] Other (See Comments)    Blisters from steristrips    Social Hx:   Social History   Social History  . Marital status: Married    Spouse name: N/A  . Number of children: N/A  . Years of education: N/A   Occupational History  . Not on file.   Social History Main Topics  . Smoking status: Never Smoker  . Smokeless tobacco: Never Used  . Alcohol use Yes     Comment: 2-or more glases daily wine  . Drug use: No  . Sexual activity: Yes    Birth control/ protection: Surgical   Other Topics Concern  . Not on file   Social History Narrative  . No narrative on file    Past Surgical Hx:  Past Surgical History:  Procedure Laterality Date  . ABDOMINOPLASTY    . BREAST SURGERY     augmentation  . [place], 2006   x 2  . CHOLECYSTECTOMY    . DILATION AND CURETTAGE OF UTERUS  08/2006  . INSERT /  REPLACE / [place]  1999, 2009   Currently has pacemaker was replaced in 2009  . LAPAROSCOPIC ASSISTED VAGINAL HYSTERECTOMY N/A 05/05/2014   Procedure: LAPAROSCOPIC ASSISTED VAGINAL HYSTERECTOMY;  Surgeon: Alejandra Pearson, MD;  Location: [facility];  Service: Gynecology;  Laterality: N/A;  . LAPAROTOMY Left 05/08/2017   Procedure: LAPAROTOMY with excision of infarcted torsed ovary;  Surgeon: Alejandra Pearson, MD;  Location: [facility];  Service: Gynecology;  Laterality: Left;  ok to schedule here per [place]  . TUBAL LIGATION    . WISDOM TOOTH EXTRACTION  1994    Past Medical Hx:  Past Medical History:  Diagnosis Date  . Anxiety   . 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 Hypertension   . Presence of permanent cardiac pacemaker    Medtroni - was replaced in 2009 -complete heart block  . Seasonal allergies   . SVD (spontaneous vaginal delivery)    x 2    Oncology Hx:    Colon cancer ([place])   05/08/2017 Initial Diagnosis    Colon cancer ([place]), LSO for torsion. Read as endometrioid at [facility].      06/03/2017 Surgery    Robotic hysterectomy left salpingectomy, right salpingo-oophorectomy, left pelvic lymphadenectomy, peritoneal biopsies and omentectomy. Pathology  consistent with mucinous adenocarcinoma and cytokeratin is positive for colon cancer. Outside pathology was reviewed at [facility] revealed a moderately differentiated adenocarcinoma with morphology and immunostaining most consistent with a GI origin. Tumor was MSI intact      06/24/2017 Procedure    Colonoscopy in [place] revealed an 8 mm sessile polyp that was removed. There was also a nonobstructing medium-sized mass found in the sigmoid colon at 30 cm from the anus. The mass was non-circumferential. It was biopsied with cold forceps for histology. The area was tattooed within the Inc.       Family Hx:  Family History  Problem Relation Age of Onset  . Hypertension Mother   . Cancer Mother 25        melanoma  . Cancer Father 24       prostate  . Cancer Maternal Grandmother 70       pancreatic  . Cancer Paternal Grandfather 45       lymphoma    Vitals:  Blood pressure 139/86, pulse 84, temperature 99 F (37.2 C), temperature source Oral, resp. rate 18, last menstrual period 04/29/2014, SpO2 100 %.  Physical Exam:  Well-nourished well-developed female in no acute distress.  Abdomen: Well-healing Pfannenstiel incision. Prior transverse incision from abdominoplasty. Periumbilical incisions well-healed. There is no evidence of any incisional hernias. Abdomen is soft and nontender with the exception of right along the incision. There is no fluid wave.  Pelvic: External genitalia within normal limits. Vagina is well epithelialized. Vaginal cuff is visualized is intact. Bimanual examination reveals no tenderness or fluctuance.  Assessment/Plan:  41 year old with Stage IVC adenocarcinoma of GI origin. IHC negative for Lynch syndrome; awaiting MSI testing. She's undergone her colonoscopy the results of which are pending. She has appointment with medical oncology (Dr. Altamease Oiler) tomorrow. The questions are the timing of her colon resection as well as what adjuvant therapy she will need. I defer to Dr. Altamease Oiler regarding this.  I think it's reasonable to move forward with hormone replacement therapy. Estradiol was sent to her pharmacy. She was provided a copy locations associated with this. If she has not feel better with regards to vasomotor symptoms and next week or 2, we can go up on the dose.  She has a PET CT scheduled for February 2. We will notify her of the results.  I will follow-up in results of her CEA from today.  I have not scheduled a final postop appointment with her as this would've depends with next steps are. She will send me a message via EPIC in basket regarding what the next steps of her treatment or so we can schedule her final postoperative exam  accordingly.  Although this visit was in the global period, greater than 25 minutes face-to-face time was spent with the patient and her husband which was above the service expected for a postoperative visit. Greater than 50% of this was spent in consultation.   Valia Wingard A., MD 06/25/2017, 12:40 PM

## 2017-06-25 NOTE — Patient Instructions (Signed)
Your PET scan is scheduled for Monday, July 2 at 11:00. Nothing to eat or drink including gum, mints, or candy after midnight.  Estradiol tablets What is this medicine? ESTRADIOL (es tra DYE ole) is an estrogen. It is mostly used as hormone replacement in menopausal women. It helps to treat hot flashes and prevent osteoporosis. It is also used to treat women with low estrogen levels or those who have had their ovaries removed. This medicine may be used for other purposes; ask your health care provider or pharmacist if you have questions. COMMON BRAND NAME(S): Estrace, Femtrace, Gynodiol What should I tell my health care provider before I take this medicine? They need to know if you have or ever had any of these conditions: -abnormal vaginal bleeding -blood vessel disease or blood clots -breast, cervical, endometrial, ovarian, liver, or uterine cancer -dementia -diabetes -gallbladder disease -heart disease or recent heart attack -high blood pressure -high cholesterol -high level of calcium in the blood -hysterectomy -kidney disease -liver disease -migraine headaches -protein C deficiency -protein S deficiency -stroke -systemic lupus erythematosus (SLE) -tobacco smoker -an unusual or allergic reaction to estrogens, other hormones, medicines, foods, dyes, or preservatives -pregnant or trying to get pregnant -breast-feeding How should I use this medicine? Take this medicine by mouth. To reduce nausea, this medicine may be taken with food. Follow the directions on the prescription label. Take this medicine at the same time each day and in the order directed on the package. Do not take your medicine more often than directed. Contact your pediatrician regarding the use of this medicine in children. Special care may be needed. A patient package insert for the product will be given with each prescription and refill. Read this sheet carefully each time. The sheet may change  frequently. Overdosage: If you think you have taken too much of this medicine contact a poison control center or emergency room at once. NOTE: This medicine is only for you. Do not share this medicine with others. What if I miss a dose? If you miss a dose, take it as soon as you can. If it is almost time for your next dose, take only that dose. Do not take double or extra doses. What may interact with this medicine? Do not take this medicine with any of the following medications: -aromatase inhibitors like aminoglutethimide, anastrozole, exemestane, letrozole, testolactone This medicine may also interact with the following medications: -carbamazepine -certain antibiotics used to treat infections -certain barbiturates or benzodiazepines used for inducing sleep or treating seizures -grapefruit juice -medicines for fungus infections like itraconazole and ketoconazole -raloxifene or tamoxifen -rifabutin, rifampin, or rifapentine -ritonavir -St. John's Wort -warfarin This list may not describe all possible interactions. Give your health care provider a list of all the medicines, herbs, non-prescription drugs, or dietary supplements you use. Also tell them if you smoke, drink alcohol, or use illegal drugs. Some items may interact with your medicine. What should I watch for while using this medicine? Visit your doctor or health care professional for regular checks on your progress. You will need a regular breast and pelvic exam and Pap smear while on this medicine. You should also discuss the need for regular mammograms with your health care professional, and follow his or her guidelines for these tests. This medicine can make your body retain fluid, making your fingers, hands, or ankles swell. Your blood pressure can go up. Contact your doctor or health care professional if you feel you are retaining fluid. If you have any reason  to think you are pregnant, stop taking this medicine right away and  contact your doctor or health care professional. Smoking increases the risk of getting a blood clot or having a stroke while you are taking this medicine, especially if you are more than 43 years old. You are strongly advised not to smoke. If you wear contact lenses and notice visual changes, or if the lenses begin to feel uncomfortable, consult your eye doctor or health care professional. This medicine can increase the risk of developing a condition (endometrial hyperplasia) that may lead to cancer of the lining of the uterus. Taking progestins, another hormone drug, with this medicine lowers the risk of developing this condition. Therefore, if your uterus has not been removed (by a hysterectomy), your doctor may prescribe a progestin for you to take together with your estrogen. You should know, however, that taking estrogens with progestins may have additional health risks. You should discuss the use of estrogens and progestins with your health care professional to determine the benefits and risks for you. If you are going to have surgery, you may need to stop taking this medicine. Consult your health care professional for advice before you schedule the surgery. What side effects may I notice from receiving this medicine? Side effects that you should report to your doctor or health care professional as soon as possible: -allergic reactions like skin rash, itching or hives, swelling of the face, lips, or tongue -breast tissue changes or discharge -changes in vision -chest pain -confusion, trouble speaking or understanding -dark urine -general ill feeling or flu-like symptoms -light-colored stools -nausea, vomiting -pain, swelling, warmth in the leg -right upper belly pain -severe headaches -shortness of breath -sudden numbness or weakness of the face, arm or leg -trouble walking, dizziness, loss of balance or coordination -unusual vaginal bleeding -yellowing of the eyes or skin Side effects  that usually do not require medical attention (report to your doctor or health care professional if they continue or are bothersome): -hair loss -increased hunger or thirst -increased urination -symptoms of vaginal infection like itching, irritation or unusual discharge -unusually weak or tired This list may not describe all possible side effects. Call your doctor for medical advice about side effects. You may report side effects to FDA at 1-800-FDA-1088. Where should I keep my medicine? Keep out of the reach of children. Store at room temperature between 20 and 25 degrees C (68 and 77 degrees F). Keep container tightly closed. Protect from light. Throw away any unused medicine after the expiration date. NOTE: This sheet is a summary. It may not cover all possible information. If you have questions about this medicine, talk to your doctor, pharmacist, or health care provider.  2018 Elsevier/Gold Standard (2011-03-20 22:63:33)

## 2017-06-26 DIAGNOSIS — C182 Malignant neoplasm of ascending colon: Secondary | ICD-10-CM | POA: Diagnosis not present

## 2017-06-26 DIAGNOSIS — C785 Secondary malignant neoplasm of large intestine and rectum: Secondary | ICD-10-CM | POA: Diagnosis not present

## 2017-06-27 ENCOUNTER — Encounter: Payer: Self-pay | Admitting: Gynecologic Oncology

## 2017-06-27 NOTE — Progress Notes (Signed)
Received message from Dr. Alycia Rossetti today stating patient would like to have PET scan at [facility].  Scan for Monday cancelled and Elmyra Ricks with prior authorizations notified.

## 2017-06-30 ENCOUNTER — Ambulatory Visit (HOSPITAL_COMMUNITY): Payer: 59

## 2017-06-30 DIAGNOSIS — C187 Malignant neoplasm of sigmoid colon: Secondary | ICD-10-CM | POA: Diagnosis not present

## 2017-07-03 DIAGNOSIS — C785 Secondary malignant neoplasm of large intestine and rectum: Secondary | ICD-10-CM | POA: Diagnosis not present

## 2017-07-03 DIAGNOSIS — C182 Malignant neoplasm of ascending colon: Secondary | ICD-10-CM | POA: Diagnosis not present

## 2017-07-04 IMAGING — CT CT ABD-PELV W/ CM
2 of 6 series · 16 of 46 positions shown, 18 images · IV contrast (OMNIPAQUE)
Comparison: None.

CLINICAL DATA: Pelvic pain

EXAM:
CT ABDOMEN AND PELVIS WITH CONTRAST
TECHNIQUE: Multidetector CT imaging of the abdomen and pelvis was performed
using the standard protocol following bolus administration of
intravenous contrast.
CONTRAST:  30mL [id number] IOPAMIDOL ([id number]) INJECTION 61%,
100mL [id number] IOPAMIDOL ([id number]) INJECTION 61%

[Series 3: (person_name) thins · axial · 0.77mm/px · z∈[+704,+1122]mm · 13 of 652 slices shown, 15 images]
[im 27/652  soft-tissue]
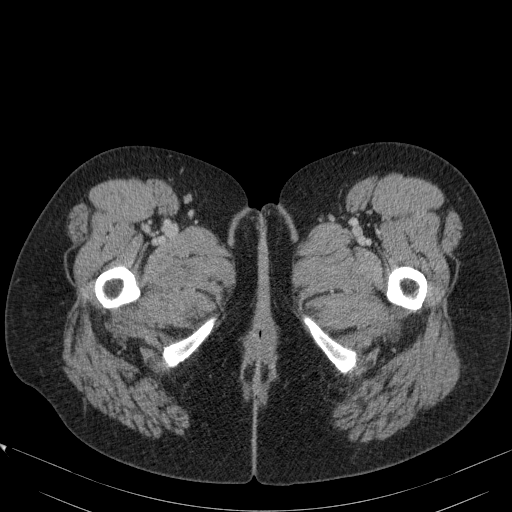
[im 27/652  bone]
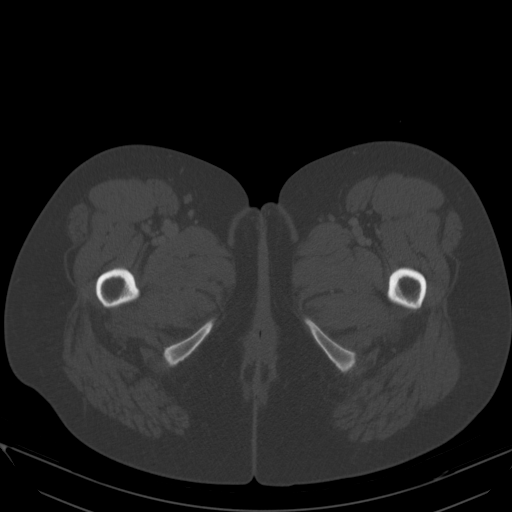
[im 79/652  soft-tissue]
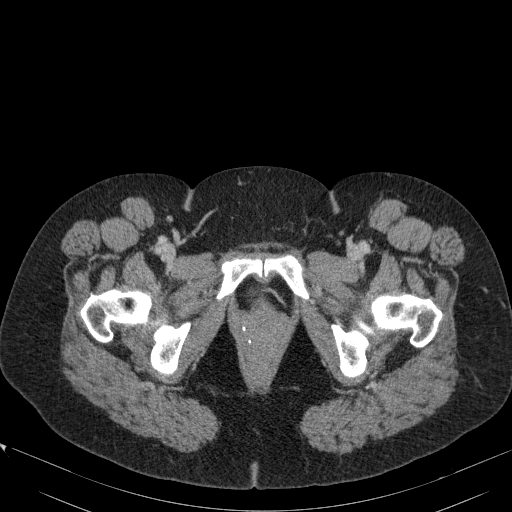
[im 131/652  soft-tissue]
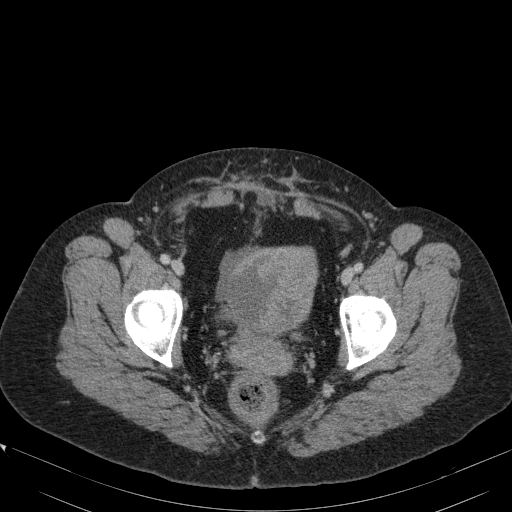
[im 183/652  soft-tissue]
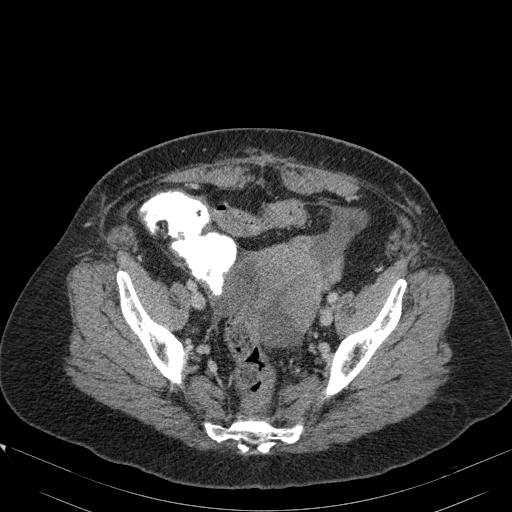
[im 235/652  soft-tissue]
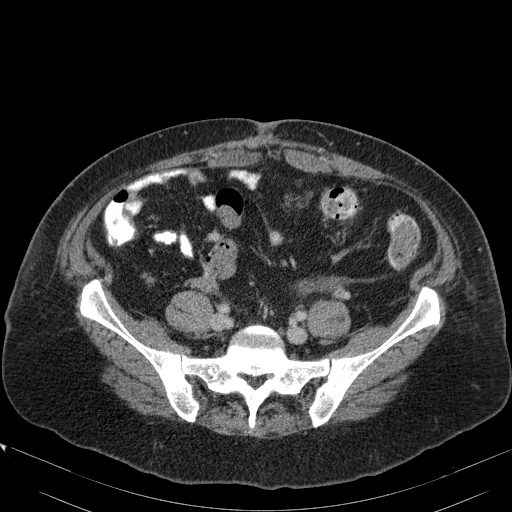
[im 287/652  soft-tissue]
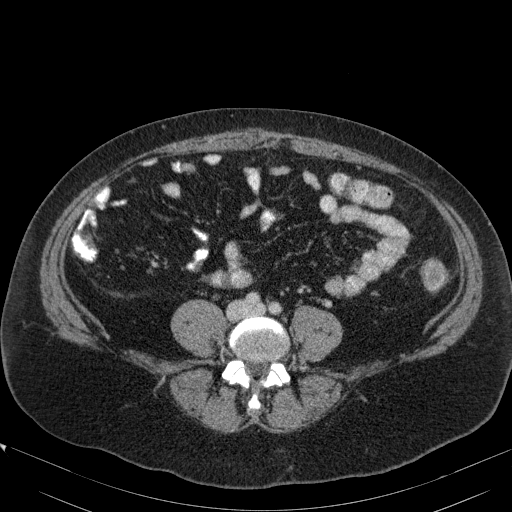
[im 339/652  soft-tissue]
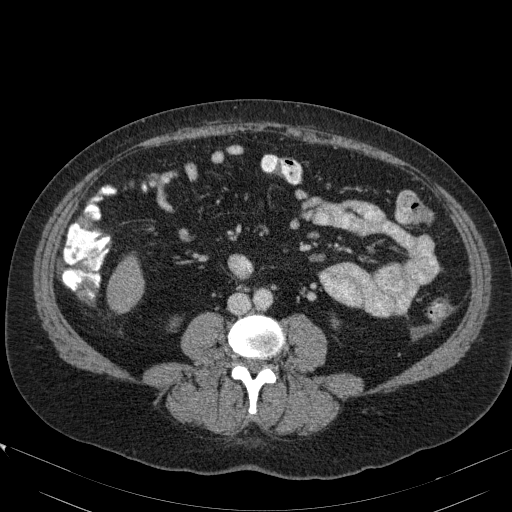
[im 365/652  soft-tissue]
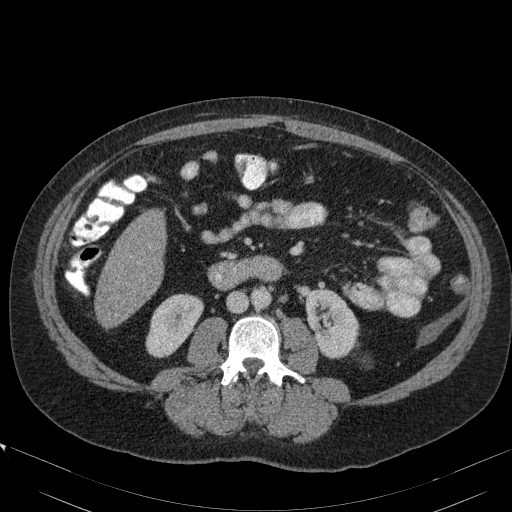
[im 417/652  soft-tissue]
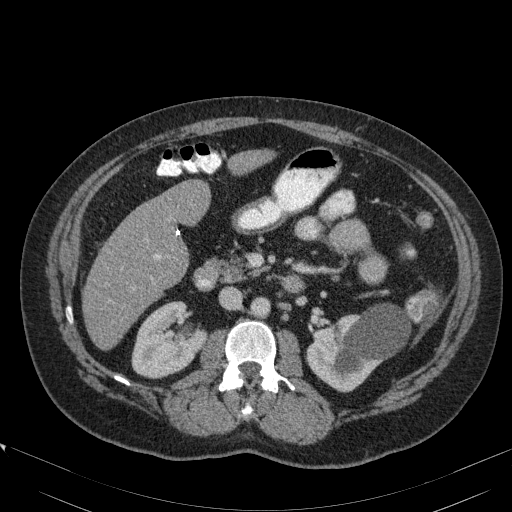
[im 417/652  bone]
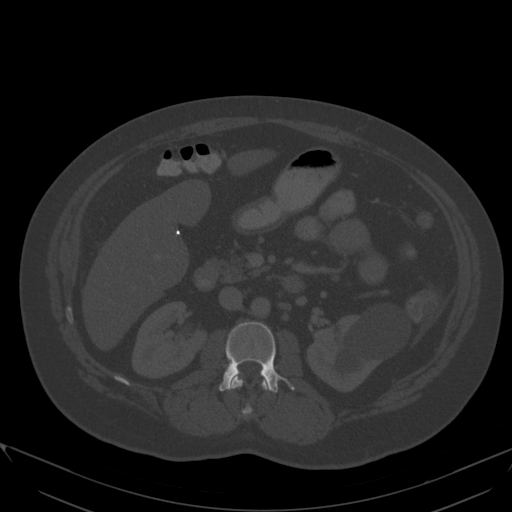
[im 469/652  soft-tissue]
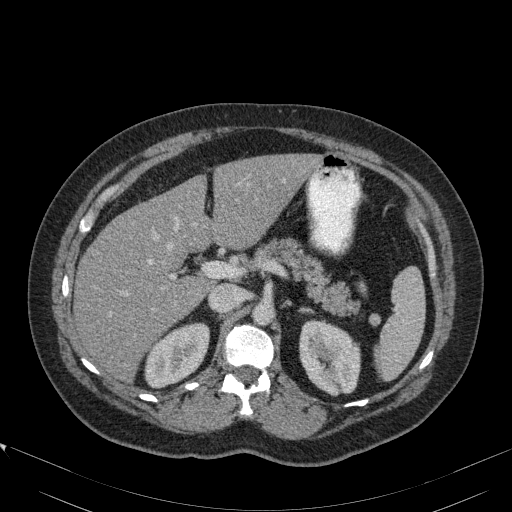
[im 521/652  soft-tissue]
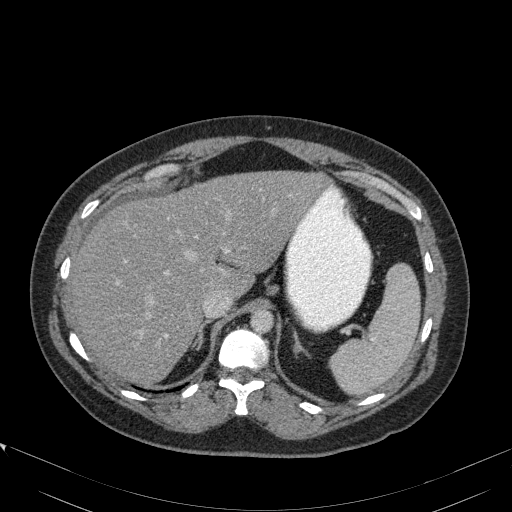
[im 573/652  soft-tissue]
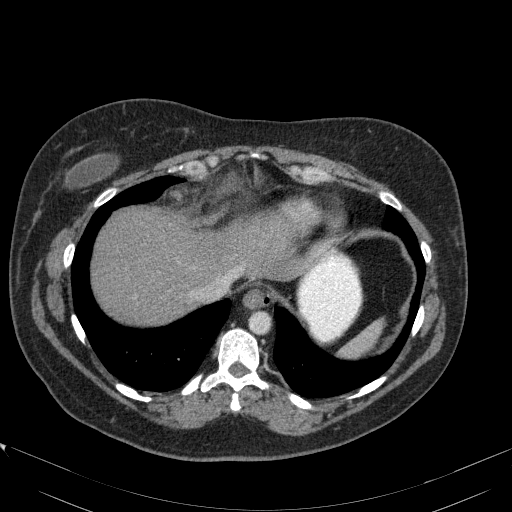
[im 625/652  soft-tissue]
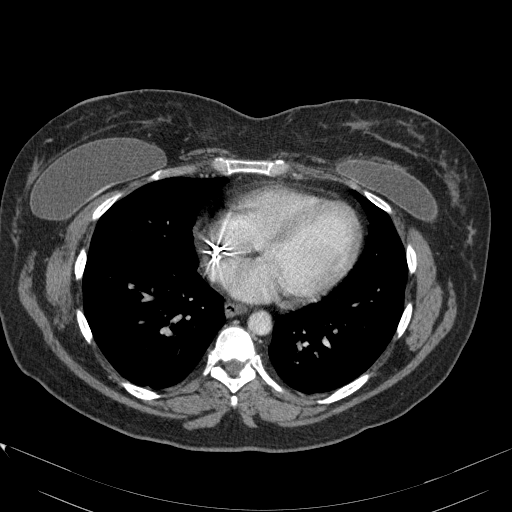

[Series 602: <mpr thick range> · coronal · 0.89mm/px · 3 of 146 slices shown]
[im 49/146  soft-tissue]
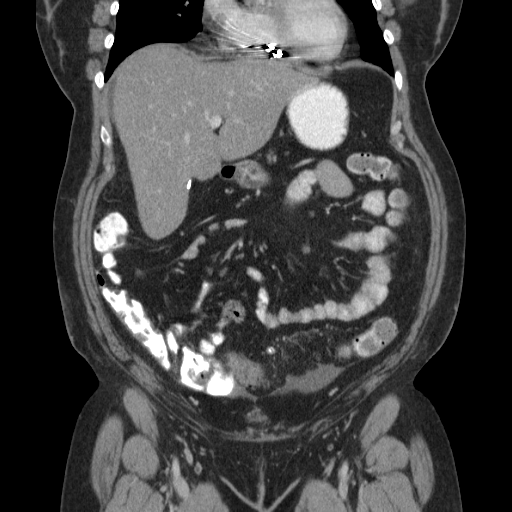
[im 65/146  soft-tissue]
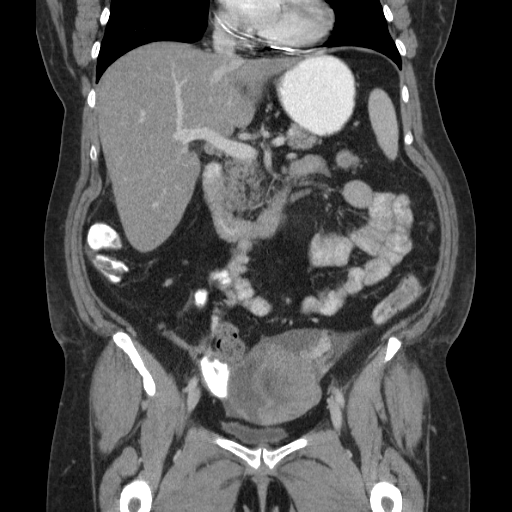
[im 81/146  soft-tissue]
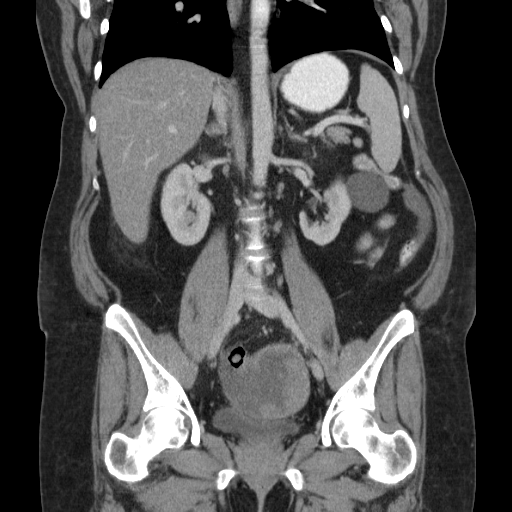

[16 of 46 positions shown; findings below may reference images not displayed]

FINDINGS: Lower chest: No pulmonary nodules. No visible pleural or pericardial
effusion.

Hepatobiliary: Normal hepatic size and contours without focal liver
lesion. No perihepatic ascites. No intra- or extrahepatic biliary
dilatation. Status post cholecystectomy

Pancreas: Normal pancreatic contours and enhancement. No
peripancreatic fluid collection or pancreatic ductal dilatation.

Spleen: Normal.

Adrenals/Urinary Tract: Normal adrenal glands. No hydronephrosis or
solid renal mass. Bilobed left renal cyst measures up to 7.7 cm.

Stomach/Bowel: There is no hiatal hernia. The stomach and duodenum
are normal. There is no dilated small bowel or enteric inflammation.
There is no colonic abnormality. The appendix is normal.

Vascular/Lymphatic: Normal course and caliber of the major abdominal
vessels. No abdominal or pelvic adenopathy.

Reproductive: The left ovary is massively enlarged with
heterogeneous attenuation. Overall, it measures 8.5 x 8.1 cm. There
is adjacent free fluid. Status post hysterectomy.

Musculoskeletal: No lytic or blastic osseous lesion. Normal
visualized extrathoracic and extraperitoneal soft tissues.

Other: No contributory non-categorized findings.
IMPRESSION: Enlarged left ovary with areas of decreased enhancement suggesting
ischemia. This appearance is highly concerning for ovarian torsion,
though CT is less effective at evaluating the vasculature than is
Doppler ultrasound.

Critical Value/emergent results were called by telephone at the time
of interpretation on 05/06/2017 at [DATE] to Dr. NARA DUKE ,
who verbally acknowledged these results.

## 2017-07-05 ENCOUNTER — Other Ambulatory Visit: Payer: Self-pay | Admitting: Internal Medicine

## 2017-07-07 DIAGNOSIS — C189 Malignant neoplasm of colon, unspecified: Secondary | ICD-10-CM | POA: Diagnosis not present

## 2017-07-07 DIAGNOSIS — Z950 Presence of cardiac pacemaker: Secondary | ICD-10-CM | POA: Diagnosis not present

## 2017-07-07 DIAGNOSIS — Z452 Encounter for adjustment and management of vascular access device: Secondary | ICD-10-CM | POA: Diagnosis not present

## 2017-07-07 DIAGNOSIS — I459 Conduction disorder, unspecified: Secondary | ICD-10-CM | POA: Diagnosis not present

## 2017-07-10 DIAGNOSIS — Z5111 Encounter for antineoplastic chemotherapy: Secondary | ICD-10-CM | POA: Diagnosis not present

## 2017-07-10 DIAGNOSIS — C187 Malignant neoplasm of sigmoid colon: Secondary | ICD-10-CM | POA: Diagnosis not present

## 2017-07-11 DIAGNOSIS — C188 Malignant neoplasm of overlapping sites of colon: Secondary | ICD-10-CM | POA: Diagnosis not present

## 2017-07-12 DIAGNOSIS — C188 Malignant neoplasm of overlapping sites of colon: Secondary | ICD-10-CM | POA: Diagnosis not present

## 2017-07-24 DIAGNOSIS — C187 Malignant neoplasm of sigmoid colon: Secondary | ICD-10-CM | POA: Diagnosis not present

## 2017-07-24 DIAGNOSIS — Z5111 Encounter for antineoplastic chemotherapy: Secondary | ICD-10-CM | POA: Diagnosis not present

## 2017-07-25 DIAGNOSIS — C188 Malignant neoplasm of overlapping sites of colon: Secondary | ICD-10-CM | POA: Diagnosis not present

## 2017-07-26 DIAGNOSIS — C188 Malignant neoplasm of overlapping sites of colon: Secondary | ICD-10-CM | POA: Diagnosis not present

## 2017-07-31 NOTE — Progress Notes (Signed)
Electrophysiology Office Note Date: 08/01/2017  ID:  [name], DOB [birth year], MRN [id number]  PCP: Marylynn Pearson, MD Electrophysiologist: Lovena Le  CC: Pacemaker follow-up  [name] is a 41 y.o. female seen today for Dr Lovena Le.  She presents today for routine electrophysiology followup.  Since last being seen in our clinic, the patient reports doing reasonably well.  She was found to have ovarian torsion and after that procedure, pathology came back positive for cancer.  Eventually was found to have colon cancer with mets. She is currently undergoing chemo and has another pending surgery for resection at [facility]. She has had some higher blood pressure readings at home, averaging <130/90. She also has a "cramp" in right neck (same side as pacemaker) that is relieved with Aleve.  She denies chest pain, palpitations, dyspnea, PND, orthopnea, dizziness, syncope, edema, weight gain, or early satiety.  Device History: MDT dual chamber PPM implanted 1999 for complete heart block, gen change 2009   Past Medical History:  Diagnosis Date  . Anxiety   . 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 Hypertension   . Presence of permanent cardiac pacemaker    Medtroni - was replaced in 2009 -complete heart block  . Seasonal allergies   . SVD (spontaneous vaginal delivery)    x 2   Past Surgical History:  Procedure Laterality Date  . ABDOMINOPLASTY    . BREAST SURGERY     augmentation  . [place], 2006   x 2  . CHOLECYSTECTOMY    . DILATION AND CURETTAGE OF UTERUS  08/2006  . INSERT / REPLACE / [place]  1999, 2009   Currently has pacemaker was replaced in 2009  . LAPAROSCOPIC ASSISTED VAGINAL HYSTERECTOMY N/A 05/05/2014   Procedure: LAPAROSCOPIC ASSISTED VAGINAL HYSTERECTOMY;  Surgeon: Marylynn Pearson, MD;  Location: [facility];  Service: Gynecology;  Laterality: N/A;  . LAPAROTOMY Left 05/08/2017   Procedure: LAPAROTOMY  with excision of infarcted torsed ovary;  Surgeon: Marylynn Pearson, MD;  Location: [facility];  Service: Gynecology;  Laterality: Left;  ok to schedule here per [place]  . TUBAL LIGATION    . WISDOM TOOTH EXTRACTION  1994    Current Outpatient Prescriptions  Medication Sig Dispense Refill  . ALPRAZolam (XANAX) 0.25 MG tablet     . buPROPion (WELLBUTRIN XL) 150 MG 24 hr tablet Take 150 mg by mouth daily.    Marland Kitchen estradiol (ESTRACE) 1 MG tablet Take 1 tablet (1 mg total) by mouth daily. 30 tablet 12  . ibuprofen (ADVIL,MOTRIN) 600 MG tablet Take 1 tablet (600 mg total) by mouth every 6 (six) hours as needed. 30 tablet 0  . LORazepam (ATIVAN) 1 MG tablet Take 1 mg by mouth daily as needed for sleep.    Marland Kitchen ondansetron (ZOFRAN) 8 MG tablet Take 8 mg by mouth daily as needed for nausea.    . prochlorperazine (COMPAZINE) 10 MG tablet Take 10 mg by mouth daily as needed for nausea.    . valsartan-hydrochlorothiazide (DIOVAN-HCT) 160-25 MG tablet TAKE 1 TABLET BY MOUTH DAILY. 15 tablet 0   No current facility-administered medications for this visit.     Allergies:   Adhesive [tape]   Social History: Social History   Social History  . Marital status: Married    Spouse name: N/A  . Number of children: N/A  . Years of education: N/A   Occupational History  . Not on file.  Social History Main Topics  . Smoking status: Never Smoker  . Smokeless tobacco: Never Used  . Alcohol use Yes     Comment: 2-or more glases daily wine  . Drug use: No  . Sexual activity: Yes    Birth control/ protection: Surgical   Other Topics Concern  . Not on file   Social History Narrative  . No narrative on file    Family History: Family History  Problem Relation Age of Onset  . Hypertension Mother   . Cancer Mother 25       melanoma  . Cancer Father 26       prostate  . Cancer Maternal Grandmother 70       pancreatic  . Cancer Paternal Grandfather 40       lymphoma     Review of Systems: All  other systems reviewed and are otherwise negative except as noted above.   Physical Exam: VS:  BP 126/88   Pulse 86   Ht 5' (1.524 m)   Wt 163 lb 3.2 oz (74 kg)   LMP 04/29/2014   BMI 31.87 kg/m  , BMI Body mass index is 31.87 kg/m.  GEN- The patient is well appearing, alert and oriented x 3 today.   HEENT: normocephalic, atraumatic; sclera clear, conjunctiva pink; hearing intact; oropharynx clear; neck supple Lungs- Clear to ausculation bilaterally, normal work of breathing.  No wheezes, rales, rhonchi Heart- Regular rate and rhythm (paced) GI- soft, non-tender, non-distended, bowel sounds present  Extremities- no clubbing, cyanosis, or edema  MS- no significant deformity or atrophy Skin- warm and dry, no rash or lesion; right sided PPM pocket well healed, left chest port Psych- euthymic mood, full affect Neuro- strength and sensation are intact  PPM Interrogation- reviewed in detail today,  See PACEART report  EKG:  EKG is not ordered today.  Recent Labs: 05/07/2017: BUN 11; Creatinine, Ser 0.71; Potassium 3.5; Sodium 138 05/09/2017: Hemoglobin 10.5; Platelets 259   Wt Readings from Last 3 Encounters:  08/01/17 163 lb 3.2 oz (74 kg)  05/14/17 168 lb 8 oz (76.4 kg)  05/08/17 169 lb (76.7 kg)     Other studies Reviewed: Additional studies/ records that were reviewed today include: Dr Tanna Furry office notes  Assessment and Plan:  1.  Complete heart block  Normal PPM function See Pace Art report No changes today  2.  HTN Stable No change required today Follow for now   Current medicines are reviewed at length with the patient today.   The patient does not have concerns regarding her medicines.  The following changes were made today:  none  Labs/ tests ordered today include:  Orders Placed This Encounter  Procedures  . CUP PACEART INCLINIC DEVICE CHECK     Disposition:   Follow up with [facility], Dr Lovena Le 1 year     Signed, Chanetta Marshall, NP 08/01/2017 9:58  AM  [facility] [address] [place] [address] [phone] (office) [phone] (fax)

## 2017-08-01 ENCOUNTER — Ambulatory Visit (INDEPENDENT_AMBULATORY_CARE_PROVIDER_SITE_OTHER): Payer: 59 | Admitting: Nurse Practitioner

## 2017-08-01 ENCOUNTER — Encounter: Payer: Self-pay | Admitting: Nurse Practitioner

## 2017-08-01 VITALS — BP 126/88 | HR 86 | Ht 60.0 in | Wt 163.2 lb

## 2017-08-01 DIAGNOSIS — I442 Atrioventricular block, complete: Secondary | ICD-10-CM

## 2017-08-01 DIAGNOSIS — I10 Essential (primary) hypertension: Secondary | ICD-10-CM | POA: Diagnosis not present

## 2017-08-01 LAB — CUP PACEART INCLINIC DEVICE CHECK
Implantable Lead Implant Date: 19990617
Implantable Lead Location: 753859
Implantable Lead Location: 753860
MDC IDC LEAD IMPLANT DT: 19990617
MDC IDC PG IMPLANT DT: 20090827
MDC IDC SESS DTM: 20180803094423

## 2017-08-01 NOTE — Patient Instructions (Signed)
Medication Instructions:  Your physician recommends that you continue on your current medications as directed. Please refer to the Current Medication list given to you today.   Labwork: None Ordered   Testing/Procedures: None Ordered   Follow-Up: Remote monitoring is used to monitor your Pacemaker of ICD from home. This monitoring reduces the number of office visits required to check your device to one time per year. It allows [place] to keep an eye on the functioning of your device to ensure it is working properly. You are scheduled for a device check from home on 10/31/17. You may send your transmission at any time that day. If you have a wireless device, the transmission will be sent automatically. After your physician reviews your transmission, you will receive a postcard with your next transmission date.   Your physician wants you to follow-up in: 1 year with Dr. Lovena Le. You will receive a reminder letter in the mail two months in advance. If you don't receive a letter, please call our office to schedule the follow-up appointment.   Any Other Special Instructions Will Be Listed Below (If Applicable).     If you need a refill on your cardiac medications before your next appointment, please call your pharmacy.

## 2017-08-08 DIAGNOSIS — C187 Malignant neoplasm of sigmoid colon: Secondary | ICD-10-CM | POA: Diagnosis not present

## 2017-08-08 DIAGNOSIS — Z5111 Encounter for antineoplastic chemotherapy: Secondary | ICD-10-CM | POA: Diagnosis not present

## 2017-08-09 DIAGNOSIS — C188 Malignant neoplasm of overlapping sites of colon: Secondary | ICD-10-CM | POA: Diagnosis not present

## 2017-08-10 DIAGNOSIS — C188 Malignant neoplasm of overlapping sites of colon: Secondary | ICD-10-CM | POA: Diagnosis not present

## 2017-08-12 ENCOUNTER — Other Ambulatory Visit: Payer: Self-pay | Admitting: Internal Medicine

## 2017-08-19 DIAGNOSIS — C187 Malignant neoplasm of sigmoid colon: Secondary | ICD-10-CM | POA: Diagnosis not present

## 2017-08-21 DIAGNOSIS — R5383 Other fatigue: Secondary | ICD-10-CM | POA: Diagnosis not present

## 2017-08-21 DIAGNOSIS — C187 Malignant neoplasm of sigmoid colon: Secondary | ICD-10-CM | POA: Diagnosis not present

## 2017-08-21 DIAGNOSIS — Z09 Encounter for follow-up examination after completed treatment for conditions other than malignant neoplasm: Secondary | ICD-10-CM | POA: Diagnosis not present

## 2017-08-21 DIAGNOSIS — Z452 Encounter for adjustment and management of vascular access device: Secondary | ICD-10-CM | POA: Diagnosis not present

## 2017-08-21 DIAGNOSIS — T82518A Breakdown (mechanical) of other cardiac and vascular devices and implants, initial encounter: Secondary | ICD-10-CM | POA: Diagnosis not present

## 2017-08-22 DIAGNOSIS — C188 Malignant neoplasm of overlapping sites of colon: Secondary | ICD-10-CM | POA: Diagnosis not present

## 2017-08-29 DIAGNOSIS — T82828A Fibrosis of vascular prosthetic devices, implants and grafts, initial encounter: Secondary | ICD-10-CM | POA: Diagnosis not present

## 2017-08-29 DIAGNOSIS — T82868A Thrombosis of vascular prosthetic devices, implants and grafts, initial encounter: Secondary | ICD-10-CM | POA: Diagnosis not present

## 2017-08-29 DIAGNOSIS — C189 Malignant neoplasm of colon, unspecified: Secondary | ICD-10-CM | POA: Diagnosis not present

## 2017-08-29 DIAGNOSIS — Z452 Encounter for adjustment and management of vascular access device: Secondary | ICD-10-CM | POA: Diagnosis not present

## 2017-08-29 DIAGNOSIS — C187 Malignant neoplasm of sigmoid colon: Secondary | ICD-10-CM | POA: Diagnosis not present

## 2017-09-03 DIAGNOSIS — C187 Malignant neoplasm of sigmoid colon: Secondary | ICD-10-CM | POA: Diagnosis not present

## 2017-09-03 DIAGNOSIS — R932 Abnormal findings on diagnostic imaging of liver and biliary tract: Secondary | ICD-10-CM | POA: Diagnosis not present

## 2017-09-04 DIAGNOSIS — C187 Malignant neoplasm of sigmoid colon: Secondary | ICD-10-CM | POA: Diagnosis not present

## 2017-09-04 DIAGNOSIS — C786 Secondary malignant neoplasm of retroperitoneum and peritoneum: Secondary | ICD-10-CM | POA: Diagnosis not present

## 2017-09-10 DIAGNOSIS — K7689 Other specified diseases of liver: Secondary | ICD-10-CM | POA: Diagnosis not present

## 2017-09-10 DIAGNOSIS — Z5111 Encounter for antineoplastic chemotherapy: Secondary | ICD-10-CM | POA: Diagnosis not present

## 2017-09-10 DIAGNOSIS — C182 Malignant neoplasm of ascending colon: Secondary | ICD-10-CM | POA: Diagnosis not present

## 2017-09-11 DIAGNOSIS — C188 Malignant neoplasm of overlapping sites of colon: Secondary | ICD-10-CM | POA: Diagnosis not present

## 2017-09-12 DIAGNOSIS — C188 Malignant neoplasm of overlapping sites of colon: Secondary | ICD-10-CM | POA: Diagnosis not present

## 2017-09-25 DIAGNOSIS — C187 Malignant neoplasm of sigmoid colon: Secondary | ICD-10-CM | POA: Diagnosis not present

## 2017-09-25 DIAGNOSIS — Z5111 Encounter for antineoplastic chemotherapy: Secondary | ICD-10-CM | POA: Diagnosis not present

## 2017-09-26 DIAGNOSIS — C188 Malignant neoplasm of overlapping sites of colon: Secondary | ICD-10-CM | POA: Diagnosis not present

## 2017-09-27 DIAGNOSIS — C188 Malignant neoplasm of overlapping sites of colon: Secondary | ICD-10-CM | POA: Diagnosis not present

## 2017-09-28 DIAGNOSIS — C189 Malignant neoplasm of colon, unspecified: Secondary | ICD-10-CM | POA: Diagnosis not present

## 2017-10-07 DIAGNOSIS — C187 Malignant neoplasm of sigmoid colon: Secondary | ICD-10-CM | POA: Diagnosis not present

## 2017-10-07 DIAGNOSIS — C801 Malignant (primary) neoplasm, unspecified: Secondary | ICD-10-CM | POA: Diagnosis not present

## 2017-10-07 DIAGNOSIS — C786 Secondary malignant neoplasm of retroperitoneum and peritoneum: Secondary | ICD-10-CM | POA: Diagnosis not present

## 2017-10-09 DIAGNOSIS — Z5111 Encounter for antineoplastic chemotherapy: Secondary | ICD-10-CM | POA: Diagnosis not present

## 2017-10-09 DIAGNOSIS — C187 Malignant neoplasm of sigmoid colon: Secondary | ICD-10-CM | POA: Diagnosis not present

## 2017-10-10 DIAGNOSIS — C188 Malignant neoplasm of overlapping sites of colon: Secondary | ICD-10-CM | POA: Diagnosis not present

## 2017-10-11 DIAGNOSIS — C188 Malignant neoplasm of overlapping sites of colon: Secondary | ICD-10-CM | POA: Diagnosis not present

## 2017-10-23 DIAGNOSIS — Z79899 Other long term (current) drug therapy: Secondary | ICD-10-CM | POA: Diagnosis not present

## 2017-10-23 DIAGNOSIS — Z950 Presence of cardiac pacemaker: Secondary | ICD-10-CM | POA: Diagnosis not present

## 2017-10-23 DIAGNOSIS — C187 Malignant neoplasm of sigmoid colon: Secondary | ICD-10-CM | POA: Diagnosis not present

## 2017-10-23 DIAGNOSIS — Z09 Encounter for follow-up examination after completed treatment for conditions other than malignant neoplasm: Secondary | ICD-10-CM | POA: Diagnosis not present

## 2017-10-23 DIAGNOSIS — Z5111 Encounter for antineoplastic chemotherapy: Secondary | ICD-10-CM | POA: Diagnosis not present

## 2017-10-24 DIAGNOSIS — C188 Malignant neoplasm of overlapping sites of colon: Secondary | ICD-10-CM | POA: Diagnosis not present

## 2017-10-25 DIAGNOSIS — C188 Malignant neoplasm of overlapping sites of colon: Secondary | ICD-10-CM | POA: Diagnosis not present

## 2017-10-31 ENCOUNTER — Encounter: Payer: 59 | Admitting: *Deleted

## 2017-11-06 DIAGNOSIS — C187 Malignant neoplasm of sigmoid colon: Secondary | ICD-10-CM | POA: Diagnosis not present

## 2017-11-06 DIAGNOSIS — Z5111 Encounter for antineoplastic chemotherapy: Secondary | ICD-10-CM | POA: Diagnosis not present

## 2017-11-07 DIAGNOSIS — C188 Malignant neoplasm of overlapping sites of colon: Secondary | ICD-10-CM | POA: Diagnosis not present

## 2017-11-08 DIAGNOSIS — C188 Malignant neoplasm of overlapping sites of colon: Secondary | ICD-10-CM | POA: Diagnosis not present

## 2017-11-27 DIAGNOSIS — C187 Malignant neoplasm of sigmoid colon: Secondary | ICD-10-CM | POA: Diagnosis not present

## 2017-11-27 DIAGNOSIS — K7689 Other specified diseases of liver: Secondary | ICD-10-CM | POA: Diagnosis not present

## 2017-11-27 DIAGNOSIS — Z5111 Encounter for antineoplastic chemotherapy: Secondary | ICD-10-CM | POA: Diagnosis not present

## 2017-11-28 DIAGNOSIS — C188 Malignant neoplasm of overlapping sites of colon: Secondary | ICD-10-CM | POA: Diagnosis not present

## 2017-11-29 DIAGNOSIS — C188 Malignant neoplasm of overlapping sites of colon: Secondary | ICD-10-CM | POA: Diagnosis not present

## 2017-12-11 DIAGNOSIS — C187 Malignant neoplasm of sigmoid colon: Secondary | ICD-10-CM | POA: Diagnosis not present

## 2017-12-11 DIAGNOSIS — Z5111 Encounter for antineoplastic chemotherapy: Secondary | ICD-10-CM | POA: Diagnosis not present

## 2017-12-25 DIAGNOSIS — Z5111 Encounter for antineoplastic chemotherapy: Secondary | ICD-10-CM | POA: Diagnosis not present

## 2017-12-25 DIAGNOSIS — C187 Malignant neoplasm of sigmoid colon: Secondary | ICD-10-CM | POA: Diagnosis not present

## 2018-01-09 DIAGNOSIS — C187 Malignant neoplasm of sigmoid colon: Secondary | ICD-10-CM | POA: Diagnosis not present

## 2018-01-09 DIAGNOSIS — R918 Other nonspecific abnormal finding of lung field: Secondary | ICD-10-CM | POA: Diagnosis not present

## 2018-01-09 DIAGNOSIS — C801 Malignant (primary) neoplasm, unspecified: Secondary | ICD-10-CM | POA: Diagnosis not present

## 2018-01-09 DIAGNOSIS — C786 Secondary malignant neoplasm of retroperitoneum and peritoneum: Secondary | ICD-10-CM | POA: Diagnosis not present

## 2018-01-27 DIAGNOSIS — Z01818 Encounter for other preprocedural examination: Secondary | ICD-10-CM | POA: Diagnosis not present

## 2018-01-27 DIAGNOSIS — Z01810 Encounter for preprocedural cardiovascular examination: Secondary | ICD-10-CM | POA: Diagnosis not present

## 2018-01-27 DIAGNOSIS — I442 Atrioventricular block, complete: Secondary | ICD-10-CM | POA: Diagnosis not present

## 2018-01-27 DIAGNOSIS — C801 Malignant (primary) neoplasm, unspecified: Secondary | ICD-10-CM | POA: Diagnosis not present

## 2018-01-27 DIAGNOSIS — C786 Secondary malignant neoplasm of retroperitoneum and peritoneum: Secondary | ICD-10-CM | POA: Diagnosis not present

## 2018-01-27 DIAGNOSIS — C187 Malignant neoplasm of sigmoid colon: Secondary | ICD-10-CM | POA: Diagnosis not present

## 2018-02-11 DIAGNOSIS — Z85038 Personal history of other malignant neoplasm of large intestine: Secondary | ICD-10-CM | POA: Diagnosis not present

## 2018-02-11 DIAGNOSIS — I10 Essential (primary) hypertension: Secondary | ICD-10-CM | POA: Diagnosis not present

## 2018-02-12 DIAGNOSIS — K654 Sclerosing mesenteritis: Secondary | ICD-10-CM | POA: Diagnosis not present

## 2018-02-12 DIAGNOSIS — C801 Malignant (primary) neoplasm, unspecified: Secondary | ICD-10-CM | POA: Diagnosis not present

## 2018-02-12 DIAGNOSIS — C786 Secondary malignant neoplasm of retroperitoneum and peritoneum: Secondary | ICD-10-CM | POA: Diagnosis not present

## 2018-02-12 DIAGNOSIS — G629 Polyneuropathy, unspecified: Secondary | ICD-10-CM | POA: Diagnosis not present

## 2018-02-12 DIAGNOSIS — I10 Essential (primary) hypertension: Secondary | ICD-10-CM | POA: Diagnosis not present

## 2018-02-12 DIAGNOSIS — R109 Unspecified abdominal pain: Secondary | ICD-10-CM | POA: Diagnosis not present

## 2018-02-12 DIAGNOSIS — C482 Malignant neoplasm of peritoneum, unspecified: Secondary | ICD-10-CM | POA: Diagnosis not present

## 2018-02-12 DIAGNOSIS — G8918 Other acute postprocedural pain: Secondary | ICD-10-CM | POA: Diagnosis not present

## 2018-02-12 DIAGNOSIS — C187 Malignant neoplasm of sigmoid colon: Secondary | ICD-10-CM | POA: Diagnosis not present

## 2018-02-12 DIAGNOSIS — C19 Malignant neoplasm of rectosigmoid junction: Secondary | ICD-10-CM | POA: Diagnosis not present

## 2018-02-12 DIAGNOSIS — Z466 Encounter for fitting and adjustment of urinary device: Secondary | ICD-10-CM | POA: Diagnosis not present

## 2018-03-13 DIAGNOSIS — C19 Malignant neoplasm of rectosigmoid junction: Secondary | ICD-10-CM | POA: Diagnosis not present

## 2018-03-13 DIAGNOSIS — Z483 Aftercare following surgery for neoplasm: Secondary | ICD-10-CM | POA: Diagnosis not present

## 2018-03-13 DIAGNOSIS — M549 Dorsalgia, unspecified: Secondary | ICD-10-CM | POA: Diagnosis not present

## 2018-04-24 DIAGNOSIS — R300 Dysuria: Secondary | ICD-10-CM | POA: Diagnosis not present

## 2018-04-29 DIAGNOSIS — Z1231 Encounter for screening mammogram for malignant neoplasm of breast: Secondary | ICD-10-CM | POA: Diagnosis not present

## 2018-04-29 DIAGNOSIS — Z6826 Body mass index (BMI) 26.0-26.9, adult: Secondary | ICD-10-CM | POA: Diagnosis not present

## 2018-04-29 DIAGNOSIS — Z01419 Encounter for gynecological examination (general) (routine) without abnormal findings: Secondary | ICD-10-CM | POA: Diagnosis not present

## 2018-04-30 DIAGNOSIS — C189 Malignant neoplasm of colon, unspecified: Secondary | ICD-10-CM | POA: Diagnosis not present

## 2018-05-11 DIAGNOSIS — C189 Malignant neoplasm of colon, unspecified: Secondary | ICD-10-CM | POA: Diagnosis not present

## 2018-05-11 DIAGNOSIS — Z452 Encounter for adjustment and management of vascular access device: Secondary | ICD-10-CM | POA: Diagnosis not present

## 2018-05-20 DIAGNOSIS — R300 Dysuria: Secondary | ICD-10-CM | POA: Diagnosis not present

## 2018-08-01 ENCOUNTER — Other Ambulatory Visit: Payer: Self-pay | Admitting: Internal Medicine

## 2018-08-13 ENCOUNTER — Ambulatory Visit (INDEPENDENT_AMBULATORY_CARE_PROVIDER_SITE_OTHER): Payer: 59 | Admitting: *Deleted

## 2018-08-13 DIAGNOSIS — I442 Atrioventricular block, complete: Secondary | ICD-10-CM | POA: Diagnosis not present

## 2018-08-13 DIAGNOSIS — I10 Essential (primary) hypertension: Secondary | ICD-10-CM

## 2018-08-14 DIAGNOSIS — Z8589 Personal history of malignant neoplasm of other organs and systems: Secondary | ICD-10-CM | POA: Diagnosis not present

## 2018-08-14 DIAGNOSIS — C187 Malignant neoplasm of sigmoid colon: Secondary | ICD-10-CM | POA: Diagnosis not present

## 2018-08-14 DIAGNOSIS — Z08 Encounter for follow-up examination after completed treatment for malignant neoplasm: Secondary | ICD-10-CM | POA: Diagnosis not present

## 2018-08-14 DIAGNOSIS — Z85038 Personal history of other malignant neoplasm of large intestine: Secondary | ICD-10-CM | POA: Diagnosis not present

## 2018-08-14 NOTE — Progress Notes (Signed)
Remote pacemaker transmission.   

## 2018-09-21 LAB — CUP PACEART REMOTE DEVICE CHECK
Battery Remaining Longevity: 34 mo
Battery Voltage: 2.75 V
Brady Statistic AP VP Percent: 3 %
Brady Statistic AS VP Percent: 97 %
Brady Statistic AS VS Percent: 0 %
Date Time Interrogation Session: 20190815214121
Implantable Lead Implant Date: 19990617
Implantable Lead Location: 753860
Lead Channel Impedance Value: 403 Ohm
Lead Channel Impedance Value: 742 Ohm
Lead Channel Pacing Threshold Amplitude: 1 V
Lead Channel Pacing Threshold Amplitude: 1.375 V
Lead Channel Setting Pacing Amplitude: 2.75 V
MDC IDC LEAD IMPLANT DT: 19990617
MDC IDC LEAD LOCATION: 753859
MDC IDC MSMT BATTERY IMPEDANCE: 1923 Ohm
MDC IDC MSMT LEADCHNL RA PACING THRESHOLD PULSEWIDTH: 0.4 ms
MDC IDC MSMT LEADCHNL RV PACING THRESHOLD PULSEWIDTH: 0.4 ms
MDC IDC PG IMPLANT DT: 20090827
MDC IDC SET LEADCHNL RV PACING AMPLITUDE: 2.5 V
MDC IDC SET LEADCHNL RV PACING PULSEWIDTH: 0.4 ms
MDC IDC SET LEADCHNL RV SENSING SENSITIVITY: 2 mV
MDC IDC STAT BRADY AP VS PERCENT: 0 %

## 2018-11-03 ENCOUNTER — Encounter: Payer: 59 | Admitting: Internal Medicine

## 2018-11-11 NOTE — Progress Notes (Signed)
Electrophysiology Office Note Date: 11/15/2018  ID:  [name], DOB 1976-09-19, MRN [id number]  PCP: Marylynn Pearson, MD Electrophysiologist: Lovena Le  CC: Pacemaker follow-up  [name] is a 42 y.o. female seen today for Dr Lovena Le.  She presents today for routine electrophysiology followup.  Since last being seen in our clinic, the patient reports doing very well.  She has completed chemotherapy for colon cancer and surveillance scans have shown no recurrence.  She denies chest pain, palpitations, dyspnea, PND, orthopnea, nausea, vomiting, dizziness, syncope, edema, weight gain, or early satiety.  Device History: MDT dual chamber PPM implanted 1999 for complete heart block; gen change 2009   Past Medical History:  Diagnosis Date  . Anxiety   . 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 Hypertension   . Presence of permanent cardiac pacemaker    Medtroni - was replaced in 2009 -complete heart block  . Seasonal allergies   . SVD (spontaneous vaginal delivery)    x 2   Past Surgical History:  Procedure Laterality Date  . ABDOMINOPLASTY    . BREAST SURGERY     augmentation  . [place], 2006   x 2  . CHOLECYSTECTOMY    . DILATION AND CURETTAGE OF UTERUS  08/2006  . INSERT / REPLACE / [place]  1999, 2009   Currently has pacemaker was replaced in 2009  . LAPAROSCOPIC ASSISTED VAGINAL HYSTERECTOMY N/A 05/05/2014   Procedure: LAPAROSCOPIC ASSISTED VAGINAL HYSTERECTOMY;  Surgeon: Marylynn Pearson, MD;  Location: [facility];  Service: Gynecology;  Laterality: N/A;  . LAPAROTOMY Left 05/08/2017   Procedure: LAPAROTOMY with excision of infarcted torsed ovary;  Surgeon: Marylynn Pearson, MD;  Location: [facility];  Service: Gynecology;  Laterality: Left;  ok to schedule here per [place]  . TUBAL LIGATION    . WISDOM TOOTH EXTRACTION  1994    Current Outpatient Medications  Medication Sig Dispense Refill  . ALPRAZolam  (XANAX) 0.25 MG tablet Take 0.25 mg by mouth at bedtime as needed for anxiety.    Marland Kitchen buPROPion (WELLBUTRIN XL) 150 MG 24 hr tablet Take 150 mg by mouth daily.    Marland Kitchen estradiol (ESTRACE) 1 MG tablet Take 1 tablet (1 mg total) by mouth daily. 30 tablet 12  . gabapentin (NEURONTIN) 300 MG capsule Take 2 capsules by mouth 3 (three) times daily.  0  . ibuprofen (ADVIL,MOTRIN) 600 MG tablet Take 1 tablet (600 mg total) by mouth every 6 (six) hours as needed. 30 tablet 0  . valsartan-hydrochlorothiazide (DIOVAN-HCT) 160-25 MG tablet Take 1 tablet by mouth daily. 90 tablet 3   No current facility-administered medications for this visit.     Allergies:   Adhesive [tape]   Social History: Social History   Socioeconomic History  . Marital status: Married    Spouse name: Not on file  . Number of children: Not on file  . Years of education: Not on file  . Highest education level: Not on file  Occupational History  . Not on file  Social Needs  . Financial resource strain: Not on file  . Food insecurity:    Worry: Not on file    Inability: Not on file  . Transportation needs:    Medical: Not on file    Non-medical: Not on file  Tobacco Use  . Smoking status: Never Smoker  . Smokeless tobacco: Never Used  Substance and Sexual Activity  . Alcohol use:  Yes    Comment: 2-or more glases daily wine  . Drug use: No  . Sexual activity: Yes    Birth control/protection: Surgical  Lifestyle  . Physical activity:    Days per week: Not on file    Minutes per session: Not on file  . Stress: Not on file  Relationships  . Social connections:    Talks on phone: Not on file    Gets together: Not on file    Attends religious service: Not on file    Active member of club or organization: Not on file    Attends meetings of clubs or organizations: Not on file    Relationship status: Not on file  . Intimate partner violence:    Fear of current or ex partner: Not on file    Emotionally abused: Not on  file    Physically abused: Not on file    Forced sexual activity: Not on file  Other Topics Concern  . Not on file  Social History Narrative  . Not on file    Family History: Family History  Problem Relation Age of Onset  . Hypertension Mother   . Cancer Mother 25       melanoma  . Cancer Father 3       prostate  . Cancer Maternal Grandmother 70       pancreatic  . Cancer Paternal Grandfather 30       lymphoma     Review of Systems: All other systems reviewed and are otherwise negative except as noted above.   Physical Exam: VS:  BP 122/76   Pulse 74   Ht 5' (1.524 m)   Wt 152 lb 12.8 oz (69.3 kg)   LMP 04/29/2014   SpO2 98%   BMI 29.84 kg/m  , BMI Body mass index is 29.84 kg/m.  GEN- The patient is well appearing, alert and oriented x 3 today.   HEENT: normocephalic, atraumatic; sclera clear, conjunctiva pink; hearing intact; oropharynx clear; neck supple  Lungs- Clear to ausculation bilaterally, normal work of breathing.  No wheezes, rales, rhonchi Heart- Regular rate and rhythm (paceed) GI- soft, non-tender, non-distended, bowel sounds present  Extremities- no clubbing, cyanosis, or edema  MS- no significant deformity or atrophy Skin- warm and dry, no rash or lesion; PPM pocket well healed Psych- euthymic mood, full affect Neuro- strength and sensation are intact  PPM Interrogation- reviewed in detail today,  See PACEART report  EKG:  EKG is not ordered today.  Recent Labs: No results found for requested labs within last 8760 hours.   Wt Readings from Last 3 Encounters:  11/12/18 152 lb 12.8 oz (69.3 kg)  08/01/17 163 lb 3.2 oz (74 kg)  05/14/17 168 lb 8 oz (76.4 kg)     Assessment and Plan:  1.  Complete heart block Normal PPM function See Pace Art report No changes today  2.  HTN Stable No change required today    Current medicines are reviewed at length with the patient today.   The patient does not have concerns regarding her  medicines.  The following changes were made today:  none  Labs/ tests ordered today include: none Orders Placed This Encounter  Procedures  . CUP PACEART [place]  . EKG 12-Lead     Disposition:   Follow up with [facility], Dr Lovena Le 1 year     Signed, Chanetta Marshall, NP 11/15/2018 7:25 AM  [facility] [place]  [place] [address] [phone] (office) [phone] (fax)

## 2018-11-12 ENCOUNTER — Encounter: Payer: Self-pay | Admitting: Nurse Practitioner

## 2018-11-12 ENCOUNTER — Ambulatory Visit: Payer: 59 | Admitting: Nurse Practitioner

## 2018-11-12 ENCOUNTER — Encounter: Payer: 59 | Admitting: *Deleted

## 2018-11-12 VITALS — BP 122/76 | HR 74 | Ht 60.0 in | Wt 152.8 lb

## 2018-11-12 DIAGNOSIS — I442 Atrioventricular block, complete: Secondary | ICD-10-CM | POA: Diagnosis not present

## 2018-11-12 DIAGNOSIS — I10 Essential (primary) hypertension: Secondary | ICD-10-CM | POA: Diagnosis not present

## 2018-11-12 LAB — CUP PACEART INCLINIC DEVICE CHECK
Implantable Lead Implant Date: 19990617
Implantable Pulse Generator Implant Date: 20090827
MDC IDC LEAD IMPLANT DT: 19990617
MDC IDC LEAD LOCATION: 753859
MDC IDC LEAD LOCATION: 753860
MDC IDC SESS DTM: 20191114105415

## 2018-11-12 MED ORDER — VALSARTAN-HYDROCHLOROTHIAZIDE 160-25 MG PO TABS: 1.0000 | ORAL_TABLET | Freq: Every day | ORAL | 3 refills | Status: DC

## 2018-11-12 NOTE — Patient Instructions (Addendum)
Medication Instructions:   Your physician recommends that you continue on your current medications as directed. Please refer to the Current Medication list given to you today.   If you need a refill on your cardiac medications before your next appointment, please call your pharmacy.   Lab work: NONE ORDERED  TODAY  If you have labs (blood work) drawn today and your tests are completely normal, you will receive your results only by: Marland Kitchen MyChart Message (if you have MyChart) OR . A paper copy in the mail If you have any lab test that is abnormal or we need to change your treatment, we will call you to review the results.  Testing/Procedures: NONE ORDERED  TODAY   Follow-Up: At [facility], you and your health needs are our priority.  As part of our continuing mission to provide you with exceptional heart care, we have created designated Provider Care Teams.  These Care Teams include your primary Cardiologist (physician) and Advanced Practice Providers (APPs -  Physician Assistants and Nurse Practitioners) who all work together to provide you with the care you need, when you need it. You will need a follow up appointment in 12 months.  Please call our office 2 months in advance to schedule this appointment.  You may see Dr. Lovena Le or one of the following Advanced Practice Providers on your designated Care Team:   Chanetta Marshall, NP . Tommye Standard, PA-C   Remote monitoring is used to monitor your Pacemaker of ICD from home. This monitoring reduces the number of office visits required to check your device to one time per year. It allows [place] to keep an eye on the functioning of your device to ensure it is working properly. You are scheduled for a device check from home on .  02-11-18. You may send your transmission at any time that day. If you have a wireless device, the transmission will be sent automatically. After your physician reviews your transmission, you will receive a postcard with your next  transmission date.   Any Other Special Instructions Will Be Listed Below (If Applicable).

## 2018-11-19 ENCOUNTER — Encounter: Payer: Self-pay | Admitting: Cardiology

## 2018-11-19 DIAGNOSIS — Z6829 Body mass index (BMI) 29.0-29.9, adult: Secondary | ICD-10-CM | POA: Diagnosis not present

## 2018-11-19 DIAGNOSIS — C189 Malignant neoplasm of colon, unspecified: Secondary | ICD-10-CM | POA: Diagnosis not present

## 2018-12-01 DIAGNOSIS — C189 Malignant neoplasm of colon, unspecified: Secondary | ICD-10-CM | POA: Diagnosis not present

## 2018-12-01 DIAGNOSIS — R590 Localized enlarged lymph nodes: Secondary | ICD-10-CM | POA: Diagnosis not present

## 2018-12-11 DIAGNOSIS — B029 Zoster without complications: Secondary | ICD-10-CM | POA: Diagnosis not present

## 2019-02-11 ENCOUNTER — Ambulatory Visit (INDEPENDENT_AMBULATORY_CARE_PROVIDER_SITE_OTHER): Payer: 59

## 2019-02-11 DIAGNOSIS — I442 Atrioventricular block, complete: Secondary | ICD-10-CM | POA: Diagnosis not present

## 2019-02-12 ENCOUNTER — Telehealth: Payer: Self-pay

## 2019-02-12 NOTE — Telephone Encounter (Signed)
Left message for patient to remind of missed remote transmission.  

## 2019-02-13 LAB — CUP PACEART REMOTE DEVICE CHECK
Battery Impedance: 1978 Ohm
Battery Remaining Longevity: 30 mo
Battery Voltage: 2.76 V
Brady Statistic AP VS Percent: 0 %
Brady Statistic AS VP Percent: 97 %
Implantable Lead Implant Date: 19990617
Implantable Lead Location: 753860
Lead Channel Impedance Value: 414 Ohm
Lead Channel Pacing Threshold Amplitude: 1.875 V
Lead Channel Pacing Threshold Pulse Width: 0.4 ms
Lead Channel Pacing Threshold Pulse Width: 0.4 ms
Lead Channel Setting Pacing Amplitude: 2.5 V
Lead Channel Setting Pacing Amplitude: 3.75 V
Lead Channel Setting Pacing Pulse Width: 0.4 ms
MDC IDC LEAD IMPLANT DT: 19990617
MDC IDC LEAD LOCATION: 753859
MDC IDC MSMT LEADCHNL RV IMPEDANCE VALUE: 789 Ohm
MDC IDC MSMT LEADCHNL RV PACING THRESHOLD AMPLITUDE: 1 V
MDC IDC PG IMPLANT DT: 20090827
MDC IDC SESS DTM: 20200214154108
MDC IDC SET LEADCHNL RV SENSING SENSITIVITY: 2 mV
MDC IDC STAT BRADY AP VP PERCENT: 3 %
MDC IDC STAT BRADY AS VS PERCENT: 0 %

## 2019-02-19 DIAGNOSIS — C187 Malignant neoplasm of sigmoid colon: Secondary | ICD-10-CM | POA: Diagnosis not present

## 2019-02-19 DIAGNOSIS — C801 Malignant (primary) neoplasm, unspecified: Secondary | ICD-10-CM | POA: Diagnosis not present

## 2019-02-19 DIAGNOSIS — C786 Secondary malignant neoplasm of retroperitoneum and peritoneum: Secondary | ICD-10-CM | POA: Diagnosis not present

## 2019-02-23 NOTE — Progress Notes (Signed)
Remote pacemaker transmission.   

## 2019-03-04 DIAGNOSIS — C187 Malignant neoplasm of sigmoid colon: Secondary | ICD-10-CM | POA: Diagnosis not present

## 2019-03-04 DIAGNOSIS — R911 Solitary pulmonary nodule: Secondary | ICD-10-CM | POA: Diagnosis not present

## 2019-04-02 DIAGNOSIS — Z9049 Acquired absence of other specified parts of digestive tract: Secondary | ICD-10-CM | POA: Diagnosis not present

## 2019-04-02 DIAGNOSIS — C187 Malignant neoplasm of sigmoid colon: Secondary | ICD-10-CM | POA: Diagnosis not present

## 2019-04-02 DIAGNOSIS — R911 Solitary pulmonary nodule: Secondary | ICD-10-CM | POA: Diagnosis not present

## 2019-05-12 DIAGNOSIS — C189 Malignant neoplasm of colon, unspecified: Secondary | ICD-10-CM | POA: Diagnosis not present

## 2019-05-13 ENCOUNTER — Encounter: Payer: 59 | Admitting: *Deleted

## 2019-05-13 ENCOUNTER — Other Ambulatory Visit: Payer: Self-pay

## 2019-05-14 ENCOUNTER — Telehealth: Payer: Self-pay

## 2019-05-14 NOTE — Telephone Encounter (Signed)
Left message for patient to remind of missed remote transmission.  

## 2019-05-18 ENCOUNTER — Encounter: Payer: Self-pay | Admitting: Cardiology

## 2019-05-27 ENCOUNTER — Ambulatory Visit (INDEPENDENT_AMBULATORY_CARE_PROVIDER_SITE_OTHER): Payer: 59 | Admitting: *Deleted

## 2019-05-27 DIAGNOSIS — I442 Atrioventricular block, complete: Secondary | ICD-10-CM

## 2019-05-27 LAB — CUP PACEART REMOTE DEVICE CHECK
Battery Impedance: 2250 Ohm
Battery Remaining Longevity: 25 mo
Battery Voltage: 2.75 V
Brady Statistic AP VP Percent: 2 %
Brady Statistic AP VS Percent: 0 %
Brady Statistic AS VP Percent: 98 %
Brady Statistic AS VS Percent: 0 %
Date Time Interrogation Session: 20200528145936
Implantable Lead Implant Date: 19990617
Implantable Lead Implant Date: 19990617
Implantable Lead Location: 753859
Implantable Lead Location: 753860
Implantable Pulse Generator Implant Date: 20090827
Lead Channel Impedance Value: 415 Ohm
Lead Channel Impedance Value: 832 Ohm
Lead Channel Pacing Threshold Amplitude: 0.875 V
Lead Channel Pacing Threshold Amplitude: 2 V
Lead Channel Pacing Threshold Pulse Width: 0.4 ms
Lead Channel Pacing Threshold Pulse Width: 0.4 ms
Lead Channel Sensing Intrinsic Amplitude: 1.4 mV
Lead Channel Setting Pacing Amplitude: 2.5 V
Lead Channel Setting Pacing Amplitude: 4 V
Lead Channel Setting Pacing Pulse Width: 0.4 ms
Lead Channel Setting Sensing Sensitivity: 2 mV

## 2019-06-02 ENCOUNTER — Encounter: Payer: Self-pay | Admitting: Cardiology

## 2019-06-02 NOTE — Progress Notes (Signed)
Remote pacemaker transmission.   

## 2019-08-26 ENCOUNTER — Ambulatory Visit (INDEPENDENT_AMBULATORY_CARE_PROVIDER_SITE_OTHER): Payer: 59 | Admitting: *Deleted

## 2019-08-26 DIAGNOSIS — I442 Atrioventricular block, complete: Secondary | ICD-10-CM

## 2019-08-27 LAB — CUP PACEART REMOTE DEVICE CHECK
Battery Impedance: 2320 Ohm
Battery Remaining Longevity: 27 mo
Battery Voltage: 2.74 V
Brady Statistic AP VP Percent: 3 %
Brady Statistic AP VS Percent: 0 %
Brady Statistic AS VP Percent: 97 %
Brady Statistic AS VS Percent: 0 %
Date Time Interrogation Session: 20200828161832
Implantable Lead Implant Date: 19990617
Implantable Lead Implant Date: 19990617
Implantable Lead Location: 753859
Implantable Lead Location: 753860
Implantable Pulse Generator Implant Date: 20090827
Lead Channel Impedance Value: 429 Ohm
Lead Channel Impedance Value: 849 Ohm
Lead Channel Pacing Threshold Amplitude: 1.125 V
Lead Channel Pacing Threshold Amplitude: 1.75 V
Lead Channel Pacing Threshold Pulse Width: 0.4 ms
Lead Channel Pacing Threshold Pulse Width: 0.4 ms
Lead Channel Setting Pacing Amplitude: 2.5 V
Lead Channel Setting Pacing Amplitude: 3.5 V
Lead Channel Setting Pacing Pulse Width: 0.4 ms
Lead Channel Setting Sensing Sensitivity: 2 mV

## 2019-09-01 NOTE — Progress Notes (Signed)
Remote pacemaker transmission.   

## 2019-12-13 ENCOUNTER — Ambulatory Visit (INDEPENDENT_AMBULATORY_CARE_PROVIDER_SITE_OTHER): Payer: 59 | Admitting: *Deleted

## 2019-12-13 DIAGNOSIS — I442 Atrioventricular block, complete: Secondary | ICD-10-CM

## 2019-12-13 LAB — CUP PACEART REMOTE DEVICE CHECK
Battery Impedance: 2336 Ohm
Battery Remaining Longevity: 26 mo
Battery Voltage: 2.74 V
Brady Statistic AP VP Percent: 3 %
Brady Statistic AP VS Percent: 0 %
Brady Statistic AS VP Percent: 97 %
Brady Statistic AS VS Percent: 0 %
Date Time Interrogation Session: 20201213195238
Implantable Lead Implant Date: 19990617
Implantable Lead Implant Date: 19990617
Implantable Lead Location: 753859
Implantable Lead Location: 753860
Implantable Pulse Generator Implant Date: 20090827
Lead Channel Impedance Value: 384 Ohm
Lead Channel Impedance Value: 741 Ohm
Lead Channel Pacing Threshold Amplitude: 1.25 V
Lead Channel Pacing Threshold Amplitude: 1.5 V
Lead Channel Pacing Threshold Pulse Width: 0.4 ms
Lead Channel Pacing Threshold Pulse Width: 0.4 ms
Lead Channel Setting Pacing Amplitude: 2.5 V
Lead Channel Setting Pacing Amplitude: 3 V
Lead Channel Setting Pacing Pulse Width: 0.46 ms
Lead Channel Setting Sensing Sensitivity: 2 mV

## 2019-12-17 ENCOUNTER — Other Ambulatory Visit: Payer: Self-pay | Admitting: *Deleted

## 2019-12-17 MED ORDER — VALSARTAN-HYDROCHLOROTHIAZIDE 160-25 MG PO TABS: 1.0000 | ORAL_TABLET | Freq: Every day | ORAL | 0 refills | Status: DC

## 2020-01-16 NOTE — Progress Notes (Signed)
PPM remote 

## 2020-01-21 ENCOUNTER — Other Ambulatory Visit: Payer: Self-pay | Admitting: Internal Medicine

## 2020-02-03 ENCOUNTER — Other Ambulatory Visit: Payer: Self-pay | Admitting: Internal Medicine

## 2020-03-10 ENCOUNTER — Other Ambulatory Visit: Payer: Self-pay | Admitting: Internal Medicine

## 2020-03-15 ENCOUNTER — Telehealth: Payer: Self-pay

## 2020-03-15 NOTE — Telephone Encounter (Signed)
Spoke with patient to remind of missed remote transmission 

## 2020-03-21 ENCOUNTER — Ambulatory Visit (INDEPENDENT_AMBULATORY_CARE_PROVIDER_SITE_OTHER): Payer: 59 | Admitting: *Deleted

## 2020-03-21 DIAGNOSIS — I442 Atrioventricular block, complete: Secondary | ICD-10-CM | POA: Diagnosis not present

## 2020-03-21 LAB — CUP PACEART REMOTE DEVICE CHECK
Battery Impedance: 2534 Ohm
Battery Remaining Longevity: 20 mo
Battery Voltage: 2.75 V
Brady Statistic AP VP Percent: 3 %
Brady Statistic AP VS Percent: 0 %
Brady Statistic AS VP Percent: 97 %
Brady Statistic AS VS Percent: 0 %
Date Time Interrogation Session: 20210323124330
Implantable Lead Implant Date: 19990617
Implantable Lead Implant Date: 19990617
Implantable Lead Location: 753859
Implantable Lead Location: 753860
Implantable Pulse Generator Implant Date: 20090827
Lead Channel Impedance Value: 411 Ohm
Lead Channel Impedance Value: 841 Ohm
Lead Channel Pacing Threshold Amplitude: 1.125 V
Lead Channel Pacing Threshold Amplitude: 2 V
Lead Channel Pacing Threshold Pulse Width: 0.4 ms
Lead Channel Pacing Threshold Pulse Width: 0.4 ms
Lead Channel Setting Pacing Amplitude: 2.5 V
Lead Channel Setting Pacing Amplitude: 4 V
Lead Channel Setting Pacing Pulse Width: 0.4 ms
Lead Channel Setting Sensing Sensitivity: 2 mV

## 2020-03-22 NOTE — Progress Notes (Signed)
PPM Remote  

## 2020-03-26 ENCOUNTER — Other Ambulatory Visit: Payer: Self-pay | Admitting: Internal Medicine

## 2020-04-19 ENCOUNTER — Other Ambulatory Visit: Payer: Self-pay | Admitting: Internal Medicine

## 2020-05-05 ENCOUNTER — Other Ambulatory Visit: Payer: Self-pay | Admitting: Internal Medicine

## 2020-05-17 ENCOUNTER — Other Ambulatory Visit: Payer: Self-pay | Admitting: Internal Medicine

## 2020-05-26 ENCOUNTER — Other Ambulatory Visit: Payer: Self-pay

## 2020-05-26 MED ORDER — VALSARTAN-HYDROCHLOROTHIAZIDE 160-25 MG PO TABS: ORAL_TABLET | ORAL | 0 refills | Status: DC

## 2020-05-26 NOTE — Telephone Encounter (Signed)
Pt's medication was sent to pt's pharmacy as requested. Confirmation received.  °

## 2020-06-13 ENCOUNTER — Ambulatory Visit: Payer: 59 | Admitting: Internal Medicine

## 2020-06-13 ENCOUNTER — Other Ambulatory Visit: Payer: Self-pay

## 2020-06-13 ENCOUNTER — Encounter: Payer: Self-pay | Admitting: Internal Medicine

## 2020-06-13 VITALS — BP 114/82 | HR 86 | Ht 60.0 in

## 2020-06-13 DIAGNOSIS — I442 Atrioventricular block, complete: Secondary | ICD-10-CM

## 2020-06-13 DIAGNOSIS — Z950 Presence of cardiac pacemaker: Secondary | ICD-10-CM | POA: Diagnosis not present

## 2020-06-13 NOTE — Patient Instructions (Signed)
Medication Instructions:  Your physician recommends that you continue on your current medications as directed. Please refer to the Current Medication list given to you today.  Labwork: None ordered.  Testing/Procedures: None ordered.  Follow-Up: Your physician wants you to follow-up in: one year with Dr. Lovena Le.   You will receive a reminder letter in the mail two months in advance. If you don't receive a letter, please call our office to schedule the follow-up appointment.  Remote monitoring is used to monitor your Pacemaker from home. This monitoring reduces the number of office visits required to check your device to one time per year. It allows [place] to keep an eye on the functioning of your device to ensure it is working properly. You are scheduled for a device check from home on 06/20/2020. You may send your transmission at any time that day. If you have a wireless device, the transmission will be sent automatically. After your physician reviews your transmission, you will receive a postcard with your next transmission date.  Any Other Special Instructions Will Be Listed Below (If Applicable).  If you need a refill on your cardiac medications before your next appointment, please call your pharmacy.

## 2020-06-13 NOTE — Progress Notes (Signed)
HPI [name] returns today after a 4 year absence from our EP clinic. 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 She developed a staph infection over 6 months ago and appears to have recovered. A TEE demonstrated no endocarditis. She has no recurrent symptoms. Compared to her visit 4 years ago, she has lost 20 lbs. Allergies  Allergen Reactions  . Adhesive [Tape] Other (See Comments)    Blisters from steristrips     Current Outpatient Medications  Medication Sig Dispense Refill  . ALPRAZolam (XANAX) 0.25 MG tablet Take 0.25 mg by mouth at bedtime as needed for anxiety.    Marland Kitchen buPROPion (WELLBUTRIN XL) 300 MG 24 hr tablet Take 300 mg by mouth daily.    . clonazePAM (KLONOPIN) 1 MG tablet Take 1 tablet by mouth daily.    Marland Kitchen estradiol (ESTRACE) 1 MG tablet Take 1 tablet (1 mg total) by mouth daily. 30 tablet 12  . gabapentin (NEURONTIN) 300 MG capsule Take 2 capsules by mouth 2 (two) times daily.   0  . ibuprofen (ADVIL,MOTRIN) 600 MG tablet Take 1 tablet (600 mg total) by mouth every 6 (six) hours as needed. 30 tablet 0  . loperamide (IMODIUM) 2 MG capsule Take 1 capsule by mouth as needed for diarrhea or loose stools.    Marland Kitchen LORazepam (ATIVAN) 1 MG tablet Take 1 mg by mouth daily.    . ondansetron (ZOFRAN-ODT) 8 MG disintegrating tablet Take 1 tablet by mouth as needed for nausea.    . valsartan-hydrochlorothiazide (DIOVAN-HCT) 160-25 MG tablet TAKE 1 TABLET BY MOUTH DAILY. Please keep upcoming appt in June with Dr. Lovena Le before anymore refills. Thank you 30 tablet 0   No current facility-administered medications for this visit.     Past Medical History:  Diagnosis Date  . Anxiety   . 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 Hypertension   . Presence of permanent cardiac pacemaker    Medtroni - was replaced in 2009  -complete heart block  . Seasonal allergies   . SVD (spontaneous vaginal delivery)    x 2    ROS:   All systems reviewed and negative except as noted in the HPI.   Past Surgical History:  Procedure Laterality Date  . ABDOMINOPLASTY    . BREAST SURGERY     augmentation  . [place], 2006   x 2  . CHOLECYSTECTOMY    . DILATION AND CURETTAGE OF UTERUS  08/2006  . INSERT / REPLACE / [place]  1999, 2009   Currently has pacemaker was replaced in 2009  . LAPAROSCOPIC ASSISTED VAGINAL HYSTERECTOMY N/A 05/05/2014   Procedure: LAPAROSCOPIC ASSISTED VAGINAL HYSTERECTOMY;  Surgeon: Marylynn Pearson, MD;  Location: [facility];  Service: Gynecology;  Laterality: N/A;  . 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 TUBAL LIGATION    . WISDOM TOOTH EXTRACTION  1994     Family History  Problem Relation Age of Onset  . Hypertension Mother   . Cancer Mother 25       melanoma  . Cancer Father 12       prostate  .  Cancer Maternal Grandmother 70       pancreatic  . Cancer Paternal Grandfather 35       lymphoma     Social History   Socioeconomic History  . Marital status: Married    Spouse name: Not on file  . Number of children: Not on file  . Years of education: Not on file  . Highest education level: Not on file  Occupational History  . Not on file  Tobacco Use  . Smoking status: Never Smoker  . Smokeless tobacco: Never Used  Vaping Use  . Vaping Use: Never used  Substance and Sexual Activity  . Alcohol use: Yes    Comment: 2-or more glases daily wine  . Drug use: No  . Sexual activity: Yes    Birth control/protection: Surgical  Other Topics Concern  . Not on file  Social History Narrative  . Not on file   Social Determinants of Health   Financial Resource Strain:   .  Difficulty of Paying Living Expenses:   Food Insecurity:   . Worried About Charity fundraiser in the Last Year:   . Arboriculturist in the Last Year:   Transportation Needs:   . Film/video editor (Medical):   Marland Kitchen Lack of Transportation (Non-Medical):   Physical Activity:   . Days of Exercise per Week:   . Minutes of Exercise per Session:   Stress:   . Feeling of Stress :   Social Connections:   . Frequency of Communication with Friends and Family:   . Frequency of Social Gatherings with Friends and Family:   . Attends Religious Services:   . Active Member of Clubs or Organizations:   . Attends Archivist Meetings:   Marland Kitchen Marital Status:   Intimate Partner Violence:   . Fear of Current or Ex-Partner:   . Emotionally Abused:   Marland Kitchen Physically Abused:   . Sexually Abused:      BP 114/82   Pulse 86   Ht 5' (1.524 m)   LMP 04/29/2014   SpO2 96%   BMI 29.84 kg/m   Physical Exam:  Well appearing NAD HEENT: Unremarkable Neck:  No JVD, no thyromegally Lymphatics:  No adenopathy Back:  No CVA tenderness Lungs:  Clear no wheezes. Indwelling port o cath HEART:  Regular rate rhythm, no murmurs, no rubs, no clicks Abd:  soft, positive bowel sounds, no organomegally, no rebound, no guarding Ext:  2 plus pulses, no edema, no cyanosis, no clubbing Skin:  No rashes no nodules Neuro:  CN II through XII intact, motor grossly intact  EKG - NSR with ventricular pacing  DEVICE  Normal device function.  See PaceArt for details.   Assess/Plan: 1. CHB - she is stable s/p PPM insertion. 2. PPM - her medtronic DDD PM has 2 years on battery. 3. Obesity - she has lost 20 lbs since her last visit. We will follow. [email or web address] [email or web address].

## 2020-06-19 ENCOUNTER — Other Ambulatory Visit: Payer: Self-pay | Admitting: Internal Medicine

## 2020-06-21 ENCOUNTER — Telehealth: Payer: Self-pay

## 2020-06-21 NOTE — Telephone Encounter (Signed)
Left message for patient to remind of missed remote transmission.  

## 2020-06-30 ENCOUNTER — Ambulatory Visit (INDEPENDENT_AMBULATORY_CARE_PROVIDER_SITE_OTHER): Payer: 59 | Admitting: *Deleted

## 2020-06-30 DIAGNOSIS — I442 Atrioventricular block, complete: Secondary | ICD-10-CM

## 2020-07-03 LAB — CUP PACEART REMOTE DEVICE CHECK
Battery Impedance: 2485 Ohm
Battery Remaining Longevity: 20 mo
Battery Voltage: 2.74 V
Brady Statistic AP VP Percent: 2 %
Brady Statistic AP VS Percent: 0 %
Brady Statistic AS VP Percent: 98 %
Brady Statistic AS VS Percent: 0 %
Date Time Interrogation Session: 20210702150206
Implantable Lead Implant Date: 19990617
Implantable Lead Implant Date: 19990617
Implantable Lead Location: 753859
Implantable Lead Location: 753860
Implantable Pulse Generator Implant Date: 20090827
Lead Channel Impedance Value: 378 Ohm
Lead Channel Impedance Value: 749 Ohm
Lead Channel Pacing Threshold Amplitude: 1 V
Lead Channel Pacing Threshold Amplitude: 2 V
Lead Channel Pacing Threshold Pulse Width: 0.4 ms
Lead Channel Pacing Threshold Pulse Width: 0.4 ms
Lead Channel Setting Pacing Amplitude: 2.5 V
Lead Channel Setting Pacing Amplitude: 4 V
Lead Channel Setting Pacing Pulse Width: 0.4 ms
Lead Channel Setting Sensing Sensitivity: 2 mV

## 2020-07-04 NOTE — Progress Notes (Signed)
Remote pacemaker transmission.   

## 2020-07-05 LAB — CUP PACEART INCLINIC DEVICE CHECK
Battery Impedance: 2492 Ohm
Battery Remaining Longevity: 27 mo
Battery Voltage: 2.74 V
Brady Statistic AP VP Percent: 3 %
Brady Statistic AP VS Percent: 0 %
Brady Statistic AS VP Percent: 97 %
Brady Statistic AS VS Percent: 0 %
Date Time Interrogation Session: 20210615171220
Implantable Lead Implant Date: 19990617
Implantable Lead Implant Date: 19990617
Implantable Lead Location: 753859
Implantable Lead Location: 753860
Implantable Pulse Generator Implant Date: 20090827
Lead Channel Impedance Value: 371 Ohm
Lead Channel Impedance Value: 774 Ohm
Lead Channel Pacing Threshold Amplitude: 0.875 V
Lead Channel Pacing Threshold Amplitude: 1 V
Lead Channel Pacing Threshold Amplitude: 1 V
Lead Channel Pacing Threshold Amplitude: 1.25 V
Lead Channel Pacing Threshold Pulse Width: 0.4 ms
Lead Channel Pacing Threshold Pulse Width: 0.4 ms
Lead Channel Pacing Threshold Pulse Width: 0.4 ms
Lead Channel Pacing Threshold Pulse Width: 0.4 ms
Lead Channel Sensing Intrinsic Amplitude: 2 mV
Lead Channel Setting Pacing Amplitude: 2.5 V
Lead Channel Setting Pacing Amplitude: 2.5 V
Lead Channel Setting Pacing Pulse Width: 0.4 ms
Lead Channel Setting Sensing Sensitivity: 2 mV

## 2020-11-02 ENCOUNTER — Ambulatory Visit (INDEPENDENT_AMBULATORY_CARE_PROVIDER_SITE_OTHER): Payer: 59

## 2020-11-02 DIAGNOSIS — I442 Atrioventricular block, complete: Secondary | ICD-10-CM | POA: Diagnosis not present

## 2020-11-02 LAB — CUP PACEART REMOTE DEVICE CHECK
Battery Impedance: 2959 Ohm
Battery Remaining Longevity: 18 mo
Battery Voltage: 2.72 V
Brady Statistic AP VP Percent: 3 %
Brady Statistic AP VS Percent: 0 %
Brady Statistic AS VP Percent: 97 %
Brady Statistic AS VS Percent: 0 %
Date Time Interrogation Session: 20211103142814
Implantable Lead Implant Date: 19990617
Implantable Lead Implant Date: 19990617
Implantable Lead Location: 753859
Implantable Lead Location: 753860
Implantable Pulse Generator Implant Date: 20090827
Lead Channel Impedance Value: 397 Ohm
Lead Channel Impedance Value: 776 Ohm
Lead Channel Pacing Threshold Amplitude: 1 V
Lead Channel Pacing Threshold Amplitude: 1.75 V
Lead Channel Pacing Threshold Pulse Width: 0.4 ms
Lead Channel Pacing Threshold Pulse Width: 0.4 ms
Lead Channel Setting Pacing Amplitude: 2.5 V
Lead Channel Setting Pacing Amplitude: 3.5 V
Lead Channel Setting Pacing Pulse Width: 0.4 ms
Lead Channel Setting Sensing Sensitivity: 2 mV

## 2020-11-02 NOTE — Progress Notes (Signed)
Remote pacemaker transmission.   

## 2020-11-06 ENCOUNTER — Other Ambulatory Visit (HOSPITAL_BASED_OUTPATIENT_CLINIC_OR_DEPARTMENT_OTHER): Payer: Self-pay | Admitting: Internal Medicine

## 2020-11-06 ENCOUNTER — Ambulatory Visit: Payer: 59 | Attending: Internal Medicine

## 2020-11-06 DIAGNOSIS — Z23 Encounter for immunization: Secondary | ICD-10-CM

## 2020-11-06 NOTE — Progress Notes (Signed)
   Covid-19 Vaccination Clinic  Name:  [name]    MRN: [id number] DOB: [birth year]  11/06/2020  [name] was observed post Covid-19 immunization for 15 minutes without incident. She was provided with Vaccine Information Sheet and instruction to access the V-Safe system.   [name] was instructed to call 911 with any severe reactions post vaccine: Marland Kitchen Difficulty breathing  . Swelling of face and throat  . A fast heartbeat  . A bad rash all over body  . Dizziness and weakness

## 2020-11-10 MED FILL — PFIZER-BIONTECH COVID-19 VA: 30 | 1 days supply | Qty: 0 | Fill #0

## 2020-12-16 ENCOUNTER — Other Ambulatory Visit: Payer: Self-pay | Admitting: Internal Medicine

## 2021-02-01 ENCOUNTER — Ambulatory Visit (INDEPENDENT_AMBULATORY_CARE_PROVIDER_SITE_OTHER): Payer: 59

## 2021-02-01 DIAGNOSIS — I442 Atrioventricular block, complete: Secondary | ICD-10-CM

## 2021-02-02 LAB — CUP PACEART REMOTE DEVICE CHECK
Battery Impedance: 2909 Ohm
Battery Remaining Longevity: 14 mo
Battery Voltage: 2.72 V
Brady Statistic AP VP Percent: 3 %
Brady Statistic AP VS Percent: 0 %
Brady Statistic AS VP Percent: 97 %
Brady Statistic AS VS Percent: 0 %
Date Time Interrogation Session: 20220204112627
Implantable Lead Implant Date: 19990617
Implantable Lead Implant Date: 19990617
Implantable Lead Location: 753859
Implantable Lead Location: 753860
Implantable Pulse Generator Implant Date: 20090827
Lead Channel Impedance Value: 404 Ohm
Lead Channel Impedance Value: 834 Ohm
Lead Channel Pacing Threshold Amplitude: 1.375 V
Lead Channel Pacing Threshold Amplitude: 1.875 V
Lead Channel Pacing Threshold Pulse Width: 0.4 ms
Lead Channel Pacing Threshold Pulse Width: 0.4 ms
Lead Channel Setting Pacing Amplitude: 2.75 V
Lead Channel Setting Pacing Amplitude: 3.75 V
Lead Channel Setting Pacing Pulse Width: 0.46 ms
Lead Channel Setting Sensing Sensitivity: 2 mV

## 2021-02-07 NOTE — Progress Notes (Signed)
Remote pacemaker transmission.   

## 2021-04-10 ENCOUNTER — Other Ambulatory Visit: Payer: Self-pay | Admitting: Internal Medicine

## 2021-07-25 NOTE — Progress Notes (Signed)
Electrophysiology Office Note Date: 07/26/2021  ID:  GILLERMINA MAIERS, DOB 1976/03/05, MRN [id number]  PCP: Doyle Askew, PA-C Primary Cardiologist: None Electrophysiologist: Cristopher Peru, MD   CC: Pacemaker follow-up  [name] is a 45 y.o. female seen today for Cristopher Peru, MD for routine electrophysiology followup.  Since last being seen in our clinic the patient reports doing very well.  she denies chest pain, palpitations, dyspnea, PND, orthopnea, nausea, vomiting, dizziness, syncope, edema, weight gain, or early satiety.  Device History: MDT dual chamber PPM implanted 1999 for complete heart block; gen change 2009  Past Medical History:  Diagnosis Date   Anxiety    Arrhythmia    Hx congenital AV heart block - pt. has indwelling pacemaker   Hypertension    Presence of permanent cardiac pacemaker    Medtroni - was replaced in 2009 -complete heart block   Seasonal allergies    SVD (spontaneous vaginal delivery)    x 2   Past Surgical History:  Procedure Laterality Date   ABDOMINOPLASTY     BREAST SURGERY     augmentation   CESAREAN SECTION  1999, 2006   x 2   CHOLECYSTECTOMY     DILATION AND CURETTAGE OF UTERUS  08/2006   INSERT / REPLACE / REMOVE PACEMAKER  1999, 2009   Currently has pacemaker was replaced in 2009   [place] N/A 05/05/2014   Procedure: [place];  Surgeon: Marylynn Pearson, MD;  Location: [facility];  Service: Gynecology;  Laterality: N/A;   LAPAROTOMY Left 05/08/2017   Procedure: 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     COVID-19 mRNA vaccine,  Pfizer, 30 MCG/0.3ML injection INJECT AS DIRECTED .3 mL 0   estradiol (ESTRACE) 1 MG tablet Take 1 tablet (1 mg total) by mouth daily. 30 tablet 12   gabapentin (NEURONTIN) 300 MG capsule Take 2 capsules by mouth 2 (two) times daily.   0   ibuprofen (ADVIL,MOTRIN) 600 MG tablet Take 1 tablet (600 mg total) by mouth every 6 (six) hours as needed. 30 tablet 0   LORazepam (ATIVAN) 1 MG tablet Take 1 mg by mouth daily.     valsartan-hydrochlorothiazide (DIOVAN-HCT) 160-25 MG tablet TAKE 1 TABLET BY MOUTH EVERY DAY 90 tablet 0   ALPRAZolam (XANAX) 0.25 MG tablet Take 0.25 mg by mouth at bedtime as needed for anxiety. (Patient not taking: Reported on 07/26/2021)     clonazePAM (KLONOPIN) 1 MG tablet Take 1 tablet by mouth daily. (Patient not taking: Reported on 07/26/2021)     loperamide (IMODIUM) 2 MG capsule Take 1 capsule by mouth as needed for diarrhea or loose stools. (Patient not taking: Reported on 07/26/2021)     No current facility-administered medications for this visit.    Allergies:   Adhesive [tape] and Chlorhexidine   Social History: Social History   Socioeconomic History   Marital status: Married    Spouse name: Not on file   Number of children: Not on file   Years of education: Not on file   Highest education level: Not on file  Occupational History  Not on file  Tobacco Use   Smoking status: Never   Smokeless tobacco: Never  Vaping Use   Vaping Use: Never used  Substance and Sexual Activity   Alcohol use: Yes    Comment: 2-or more glases daily wine   Drug use: No   Sexual activity: Yes    Birth control/protection: Surgical  Other Topics Concern   Not on file  Social History Narrative   Not on file   Social Determinants of Health   Financial Resource Strain: Not on file  Food Insecurity: Not on file  Transportation Needs: Not on file  Physical Activity: Not on file  Stress: Not on file  Social Connections: Not on file  Intimate Partner Violence: Not on file     Family History: Family History  Problem Relation Age of Onset   Hypertension Mother    Cancer Mother 58       melanoma   Cancer Father 56       prostate   Cancer Maternal Grandmother 20       pancreatic   Cancer Paternal Grandfather 40       lymphoma     Review of Systems: All other systems reviewed and are otherwise negative except as noted above.  Physical Exam: Vitals:   07/26/21 1113  BP: 118/68  Pulse: 87  SpO2: 99%  Weight: 142 lb 12.8 oz (64.8 kg)  Height: 5' (1.524 m)     GEN- The patient is well appearing, alert and oriented x 3 today.   HEENT: normocephalic, atraumatic; sclera clear, conjunctiva pink; hearing intact; oropharynx clear; neck supple  Lungs- Clear to ausculation bilaterally, normal work of breathing.  No wheezes, rales, rhonchi Heart- Regular rate and rhythm, no murmurs, rubs or gallops  GI- soft, non-tender, non-distended, bowel sounds present  Extremities- no clubbing or cyanosis. No edema MS- no significant deformity or atrophy Skin- warm and dry, no rash or lesion; PPM pocket well healed Psych- euthymic mood, full affect Neuro- strength and sensation are intact  PPM Interrogation- reviewed in detail today,  See PACEART report  EKG:  EKG is ordered today. The ekg ordered today shows AS - VP at 87 bpm  Recent Labs: No results found for requested labs within last 8760 hours.   Wt Readings from Last 3 Encounters:  07/26/21 142 lb 12.8 oz (64.8 kg)  11/12/18 152 lb 12.8 oz (69.3 kg)  08/01/17 163 lb 3.2 oz (74 kg)     Other studies Reviewed: Additional studies/ records that were reviewed today include: Previous EP office notes, Previous remote checks, Most recent labwork.   Assessment and Plan:  1. CHB s/p Medtronic PPM  Normal PPM function See Pace Art report No changes today RA threshold chronically elevated but no A pacing.   2. Obesity Body mass index is 27.89 kg/m.  Encouraged continued lifestyle  modification   Current medicines are reviewed at length with the patient today.   The patient does not have concerns regarding her medicines.  The following changes were made today:  none  Labs/ tests ordered today include:  No orders of the defined types were placed in this encounter.  Disposition:   Follow up with Dr. Lovena Le in ~7 months to discuss gen change as should be at or nearing ERI at that time.     Jacalyn Lefevre, PA-C  07/26/2021 11:24 AM  [facility] [address] [place] [address] [phone] (office) [phone] (fax)

## 2021-07-26 ENCOUNTER — Ambulatory Visit: Payer: 59 | Admitting: Student

## 2021-07-26 ENCOUNTER — Other Ambulatory Visit: Payer: Self-pay

## 2021-07-26 ENCOUNTER — Encounter: Payer: Self-pay | Admitting: Student

## 2021-07-26 VITALS — BP 118/68 | HR 87 | Ht 60.0 in | Wt 142.8 lb

## 2021-07-26 DIAGNOSIS — Z950 Presence of cardiac pacemaker: Secondary | ICD-10-CM

## 2021-07-26 DIAGNOSIS — I442 Atrioventricular block, complete: Secondary | ICD-10-CM

## 2021-07-26 LAB — CUP PACEART INCLINIC DEVICE CHECK
Battery Impedance: 3415 Ohm
Battery Remaining Longevity: 7 mo
Battery Voltage: 2.71 V
Brady Statistic AP VP Percent: 2 %
Brady Statistic AP VS Percent: 0 %
Brady Statistic AS VP Percent: 98 %
Brady Statistic AS VS Percent: 0 %
Date Time Interrogation Session: 20220728123058
Implantable Lead Implant Date: 19990617
Implantable Lead Implant Date: 19990617
Implantable Lead Location: 753859
Implantable Lead Location: 753860
Implantable Pulse Generator Implant Date: 20090827
Lead Channel Impedance Value: 422 Ohm
Lead Channel Impedance Value: 776 Ohm
Lead Channel Pacing Threshold Amplitude: 1 V
Lead Channel Pacing Threshold Amplitude: 1.375 V
Lead Channel Pacing Threshold Amplitude: 2 V
Lead Channel Pacing Threshold Amplitude: 2.25 V
Lead Channel Pacing Threshold Amplitude: 4.5 V
Lead Channel Pacing Threshold Pulse Width: 0.4 ms
Lead Channel Pacing Threshold Pulse Width: 0.4 ms
Lead Channel Pacing Threshold Pulse Width: 0.4 ms
Lead Channel Pacing Threshold Pulse Width: 0.4 ms
Lead Channel Pacing Threshold Pulse Width: 0.4 ms
Lead Channel Sensing Intrinsic Amplitude: 2 mV
Lead Channel Setting Pacing Amplitude: 2.75 V
Lead Channel Setting Pacing Amplitude: 4.5 V
Lead Channel Setting Pacing Pulse Width: 0.4 ms
Lead Channel Setting Sensing Sensitivity: 2 mV

## 2021-07-26 NOTE — Patient Instructions (Signed)
Medication Instructions:  Your physician recommends that you continue on your current medications as directed. Please refer to the Current Medication list given to you today.  *If you need a refill on your cardiac medications before your next appointment, please call your pharmacy*   Lab Work: None If you have labs (blood work) drawn today and your tests are completely normal, you will receive your results only by: [place] (if you have MyChart) OR A paper copy in the mail If you have any lab test that is abnormal or we need to change your treatment, we will call you to review the results.   Follow-Up: At [facility], you and your health needs are our priority.  As part of our continuing mission to provide you with exceptional heart care, we have created designated Provider Care Teams.  These Care Teams include your primary Cardiologist (physician) and Advanced Practice Providers (APPs -  Physician Assistants and Nurse Practitioners) who all work together to provide you with the care you need, when you need it.  We recommend signing up for the patient portal called "MyChart".  Sign up information is provided on this After Visit Summary.  MyChart is used to connect with patients for Virtual Visits (Telemedicine).  Patients are able to view lab/test results, encounter notes, upcoming appointments, etc.  Non-urgent messages can be sent to your provider as well.   To learn more about what you can do with MyChart, go to [email or web address].    Your next appointment:   7 month(s)  The format for your next appointment:   In Person  Provider:   Cristopher Peru, MD

## 2021-09-21 ENCOUNTER — Ambulatory Visit (INDEPENDENT_AMBULATORY_CARE_PROVIDER_SITE_OTHER): Payer: 59

## 2021-09-21 DIAGNOSIS — I442 Atrioventricular block, complete: Secondary | ICD-10-CM | POA: Diagnosis not present

## 2021-09-23 LAB — CUP PACEART REMOTE DEVICE CHECK
Battery Impedance: 3533 Ohm
Battery Remaining Longevity: 13 mo
Battery Voltage: 2.73 V
Brady Statistic AP VP Percent: 2 %
Brady Statistic AP VS Percent: 0 %
Brady Statistic AS VP Percent: 98 %
Brady Statistic AS VS Percent: 0 %
Date Time Interrogation Session: 20220922130329
Implantable Lead Implant Date: 19990617
Implantable Lead Implant Date: 19990617
Implantable Lead Location: 753859
Implantable Lead Location: 753860
Implantable Pulse Generator Implant Date: 20090827
Lead Channel Impedance Value: 419 Ohm
Lead Channel Impedance Value: 808 Ohm
Lead Channel Pacing Threshold Amplitude: 1.25 V
Lead Channel Pacing Threshold Amplitude: 1.5 V
Lead Channel Pacing Threshold Pulse Width: 0.4 ms
Lead Channel Pacing Threshold Pulse Width: 0.4 ms
Lead Channel Setting Pacing Amplitude: 2.5 V
Lead Channel Setting Pacing Amplitude: 3.5 V
Lead Channel Setting Pacing Pulse Width: 0.4 ms
Lead Channel Setting Sensing Sensitivity: 2 mV

## 2021-09-25 NOTE — Progress Notes (Signed)
Remote pacemaker transmission.   

## 2021-10-22 ENCOUNTER — Other Ambulatory Visit: Payer: Self-pay | Admitting: Internal Medicine

## 2022-01-28 ENCOUNTER — Ambulatory Visit: Payer: 59 | Admitting: Internal Medicine

## 2022-01-28 ENCOUNTER — Encounter: Payer: Self-pay | Admitting: Internal Medicine

## 2022-01-28 ENCOUNTER — Other Ambulatory Visit: Payer: Self-pay

## 2022-01-28 VITALS — BP 132/84 | HR 88 | Ht 60.0 in | Wt 123.4 lb

## 2022-01-28 DIAGNOSIS — I442 Atrioventricular block, complete: Secondary | ICD-10-CM | POA: Diagnosis not present

## 2022-01-28 DIAGNOSIS — Z01818 Encounter for other preprocedural examination: Secondary | ICD-10-CM | POA: Diagnosis not present

## 2022-01-28 NOTE — Patient Instructions (Signed)
Medication Instructions:  Your physician recommends that you continue on your current medications as directed. Please refer to the Current Medication list given to you today. *If you need a refill on your cardiac medications before your next appointment, please call your pharmacy*  Lab Work: None. If you have labs (blood work) drawn today and your tests are completely normal, you will receive your results only by: [place] (if you have MyChart) OR A paper copy in the mail If you have any lab test that is abnormal or we need to change your treatment, we will call you to review the results.  Testing/Procedures: None.  Follow-Up: At [facility], you and your health needs are our priority.  As part of our continuing mission to provide you with exceptional heart care, we have created designated Provider Care Teams.  These Care Teams include your primary Cardiologist (physician) and Advanced Practice Providers (APPs -  Physician Assistants and Nurse Practitioners) who all work together to provide you with the care you need, when you need it.  Your physician wants you to follow-up in: August with Cristopher Peru, MD    You will receive a reminder letter in the mail two months in advance. If you don't receive a letter, please call our office to schedule the follow-up appointment.   We recommend signing up for the patient portal called "MyChart".  Sign up information is provided on this After Visit Summary.  MyChart is used to connect with patients for Virtual Visits (Telemedicine).  Patients are able to view lab/test results, encounter notes, upcoming appointments, etc.  Non-urgent messages can be sent to your provider as well.   To learn more about what you can do with MyChart, go to [email or web address].    Any Other Special Instructions Will Be Listed Below (If Applicable).

## 2022-01-28 NOTE — Progress Notes (Signed)
HPI Ms. Bogen returns today for followup.  She is a pleasant 46 yo woman with congenital CHB, s/p PPM insertion at age 32. 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 She developed a staph infection over 24 months ago and appears to have recovered. A TEE demonstrated no endocarditis. She has been off of anti-biotics and had no recurrent symptoms of infection. Compared to her visit 5 years ago, she has lost 50 lbs. She has developed symptoms with her chemotherapy. She has an indwelling left subclavian port.  Allergies  Allergen Reactions   Adhesive [Tape] Other (See Comments)    Blisters from steristrips   Chlorhexidine Rash    Causes blisters Causes blisters      Current Outpatient Medications  Medication Sig Dispense Refill   buPROPion (WELLBUTRIN XL) 300 MG 24 hr tablet Take 300 mg by mouth daily.     estradiol (ESTRACE) 1 MG tablet Take 1 tablet (1 mg total) by mouth daily. 30 tablet 12   gabapentin (NEURONTIN) 300 MG capsule Take 2 capsules by mouth 2 (two) times daily.   0   ibuprofen (ADVIL,MOTRIN) 600 MG tablet Take 1 tablet (600 mg total) by mouth every 6 (six) hours as needed. 30 tablet 0   LORazepam (ATIVAN) 1 MG tablet Take 1 mg by mouth daily.     valsartan-hydrochlorothiazide (DIOVAN-HCT) 160-25 MG tablet TAKE 1 TABLET BY MOUTH EVERY DAY 90 tablet 3   No current facility-administered medications for this visit.     Past Medical History:  Diagnosis Date   Anxiety    Arrhythmia    Hx congenital AV heart block - pt. has indwelling pacemaker   Hypertension    Presence of permanent cardiac pacemaker    Medtroni - was replaced in 2009 -complete heart block   Seasonal allergies    SVD (spontaneous vaginal delivery)    x 2    ROS:   All systems reviewed and negative except as noted in the HPI.   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;  Surgeon: Marylynn Pearson, MD;  Location: [facility];  Service: Gynecology;  Laterality: Left;  ok to schedule here per [place]     Family History  Problem Relation Age of Onset   Hypertension Mother    Cancer Mother 61       melanoma   Cancer Father 22       prostate   Cancer Maternal Grandmother 49       pancreatic   Cancer Paternal Grandfather 7       lymphoma     Social History   Socioeconomic History   Marital status: Married    Spouse name: Not on file   Number of children: Not on file   Years  of education: Not on file   Highest education level: Not on file  Occupational History   Not on file  Tobacco Use   Smoking status: Never   Smokeless tobacco: Never  Vaping Use   Vaping Use: Never used  Substance and Sexual Activity   Alcohol use: Yes    Comment: 2-or more glases daily wine   Drug use: No   Sexual activity: Yes    Birth control/protection: Surgical  Other Topics Concern   Not on file  Social History Narrative   Not on file   Social Determinants of Health   Financial Resource Strain: Not on file  Food Insecurity: Not on file  Transportation Needs: Not on file  Physical Activity: Not on file  Stress: Not on file  Social Connections: Not on file  Intimate Partner Violence: Not on file     BP 132/84    Pulse 88    Ht 5' (1.524 m)    Wt 123 lb 6.4 oz (56 kg)    LMP 04/29/2014    SpO2 97%    BMI 24.10 kg/m   Physical  Exam:  Well appearing NAD HEENT: Unremarkable Neck:  No JVD, no thyromegally Lymphatics:  No adenopathy Back:  No CVA tenderness Lungs:  Clear with no wheezes HEART:  Regular rate rhythm, no murmurs, no rubs, no clicks Abd:  soft, positive bowel sounds, no organomegally, no rebound, no guarding Ext:  2 plus pulses, no edema, no cyanosis, no clubbing Skin:  No rashes no nodules Neuro:  CN II through XII intact, motor grossly intact  EKG - NSR with ventricular pacing  DEVICE  Normal device function.  See PaceArt for details.   Assess/Plan:  1. CHB - she is stable s/p PPM insertion. She is still about 8 months from ERI.  2. PPM - her medtronic DDD PM is working normally.  3. Obesity - she has lost 50 lbs over 5 years. We will follow. 4. Pain during chemotherapy - the etiology is unclear. She is pending a visit with a vascular MD.  [email or web address] [email or web address].D

## 2022-04-04 ENCOUNTER — Ambulatory Visit (INDEPENDENT_AMBULATORY_CARE_PROVIDER_SITE_OTHER): Payer: 59

## 2022-04-04 DIAGNOSIS — I442 Atrioventricular block, complete: Secondary | ICD-10-CM

## 2022-04-04 LAB — CUP PACEART REMOTE DEVICE CHECK
Battery Impedance: 4380 Ohm
Battery Remaining Longevity: 6 mo
Battery Voltage: 2.69 V
Brady Statistic AP VP Percent: 5 %
Brady Statistic AP VS Percent: 0 %
Brady Statistic AS VP Percent: 95 %
Brady Statistic AS VS Percent: 0 %
Date Time Interrogation Session: 20230405164100
Implantable Lead Implant Date: 19990617
Implantable Lead Implant Date: 19990617
Implantable Lead Location: 753859
Implantable Lead Location: 753860
Implantable Pulse Generator Implant Date: 20090827
Lead Channel Impedance Value: 445 Ohm
Lead Channel Impedance Value: 784 Ohm
Lead Channel Pacing Threshold Amplitude: 0.875 V
Lead Channel Pacing Threshold Amplitude: 1.75 V
Lead Channel Pacing Threshold Pulse Width: 0.4 ms
Lead Channel Pacing Threshold Pulse Width: 0.4 ms
Lead Channel Setting Pacing Amplitude: 2.5 V
Lead Channel Setting Pacing Amplitude: 3.5 V
Lead Channel Setting Pacing Pulse Width: 0.4 ms
Lead Channel Setting Sensing Sensitivity: 2 mV

## 2022-04-19 NOTE — Progress Notes (Signed)
Remote pacemaker transmission.   

## 2022-05-02 ENCOUNTER — Other Ambulatory Visit: Payer: Self-pay

## 2022-05-02 MED ORDER — VALSARTAN-HYDROCHLOROTHIAZIDE 160-25 MG PO TABS
1.0000 | ORAL_TABLET | Freq: Every day | ORAL | 2 refills | Status: DC
Start: 2022-05-02 — End: 2023-02-07

## 2022-05-02 MED ORDER — VALSARTAN-HYDROCHLOROTHIAZIDE 160-25 MG PO TABS: 1.0000 | ORAL_TABLET | Freq: Every day | ORAL | 2 refills | Status: DC

## 2022-05-02 NOTE — Addendum Note (Signed)
Addended by: Carter Kitten D on: 05/02/2022 12:07 PM ? ? Modules accepted: Orders ? ?

## 2022-05-02 NOTE — Telephone Encounter (Signed)
Pt's medication was sent to pt's pharmacy as requested. Confirmation received.  °

## 2022-08-20 ENCOUNTER — Encounter: Payer: Self-pay | Admitting: Internal Medicine

## 2022-08-20 ENCOUNTER — Ambulatory Visit (INDEPENDENT_AMBULATORY_CARE_PROVIDER_SITE_OTHER): Payer: 59 | Admitting: Internal Medicine

## 2022-08-20 VITALS — BP 122/84 | Ht 60.0 in | Wt 112.4 lb

## 2022-08-20 DIAGNOSIS — Z950 Presence of cardiac pacemaker: Secondary | ICD-10-CM

## 2022-08-20 DIAGNOSIS — I442 Atrioventricular block, complete: Secondary | ICD-10-CM

## 2022-08-20 LAB — CUP PACEART INCLINIC DEVICE CHECK
Battery Impedance: 6147 Ohm
Battery Remaining Longevity: 1 mo — CL
Battery Voltage: 2.64 V
Brady Statistic AP VP Percent: 6 %
Brady Statistic AP VS Percent: 0 %
Brady Statistic AS VP Percent: 94 %
Brady Statistic AS VS Percent: 0 %
Date Time Interrogation Session: 20230822165144
Implantable Lead Implant Date: 19990617
Implantable Lead Implant Date: 19990617
Implantable Lead Location: 753859
Implantable Lead Location: 753860
Implantable Pulse Generator Implant Date: 20090827
Lead Channel Impedance Value: 427 Ohm
Lead Channel Impedance Value: 733 Ohm
Lead Channel Pacing Threshold Amplitude: 1.125 V
Lead Channel Pacing Threshold Amplitude: 1.625 V
Lead Channel Pacing Threshold Pulse Width: 0.4 ms
Lead Channel Pacing Threshold Pulse Width: 0.4 ms
Lead Channel Sensing Intrinsic Amplitude: 4 mV
Lead Channel Setting Pacing Amplitude: 2.5 V
Lead Channel Setting Pacing Amplitude: 3.25 V
Lead Channel Setting Pacing Pulse Width: 0.4 ms
Lead Channel Setting Sensing Sensitivity: 2 mV

## 2022-08-20 NOTE — Progress Notes (Signed)
HPI [name] returns today for followup. She is a pleasant 46 yo woman with probable congenital comlete heart block discovered 24 years ago, s/p PPM insertion. She developed stage 4 colon CA and has been on chronic chemotherapy. 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 She had a staph infection 3 years ago and was treated with prolonged anti-biotics. She has a rash/blisters with tape. She has been reciveing continuous chemotherapy but she has had some held in anticipation for her need for her PM generator change out. She denies fever/chills/night sweats.  Allergies  Allergen Reactions   Adhesive [Tape] Other (See Comments)    Blisters from steristrips   Chlorhexidine Rash    Causes blisters Causes blisters      Current Outpatient Medications  Medication Sig Dispense Refill   buPROPion (WELLBUTRIN XL) 300 MG 24 hr tablet Take 300 mg by mouth daily.     estradiol (ESTRACE) 1 MG tablet Take 1 tablet (1 mg total) by mouth daily. 30 tablet 12   gabapentin (NEURONTIN) 300 MG capsule Take 2 capsules by mouth 2 (two) times daily.   0   ibuprofen (ADVIL,MOTRIN) 600 MG tablet Take 1 tablet (600 mg total) by mouth every 6 (six) hours as needed. 30 tablet 0   LORazepam (ATIVAN) 1 MG tablet Take 1 mg by mouth daily.     valsartan-hydrochlorothiazide (DIOVAN-HCT) 160-25 MG tablet Take 1 tablet by mouth daily. 90 tablet 2   No current facility-administered medications for this visit.     Past Medical History:  Diagnosis Date   Anxiety    Arrhythmia    Hx congenital AV heart block - pt. has indwelling pacemaker   Hypertension    Presence of permanent cardiac pacemaker    Medtroni - was replaced in 2009 -complete heart block   Seasonal allergies    SVD (spontaneous vaginal delivery)    x 2    ROS:   All systems reviewed and negative except as noted in the HPI.   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;  Surgeon: Marylynn Pearson, MD;  Location: [facility];  Service: Gynecology;  Laterality: Left;  ok to schedule here per [place]     Family History  Problem Relation Age of Onset   Hypertension Mother    Cancer Mother 82       melanoma   Cancer Father 15       prostate   Cancer Maternal Grandmother 8       pancreatic   Cancer Paternal Grandfather 64       lymphoma     Social History   Socioeconomic History   Marital status: Married    Spouse name: Not on file   Number  of children: Not on file   Years of education: Not on file   Highest education level: Not on file  Occupational History   Not on file  Tobacco Use   Smoking status: Never   Smokeless tobacco: Never  Vaping Use   Vaping Use: Never used  Substance and Sexual Activity   Alcohol use: Yes    Comment: 2-or more glases daily wine   Drug use: No   Sexual activity: Yes    Birth control/protection: Surgical  Other Topics Concern   Not on file  Social History Narrative   Not on file   Social Determinants of Health   Financial Resource Strain: Not on file  Food Insecurity: Not on file  Transportation Needs: Not on file  Physical Activity: Not on file  Stress: Not on file  Social Connections: Not on file  Intimate Partner Violence: Not on file     BP 122/84   Ht 5' (1.524 m)   Wt 112 lb 6.4 oz (51 kg)   LMP  04/29/2014   SpO2 99%   BMI 21.95 kg/m   Physical Exam:  Well appearing 46 yo woman, NAD HEENT: Unremarkable Neck:  No JVD, no thyromegally Lymphatics:  No adenopathy Back:  No CVA tenderness Lungs:  Clear with no wheezes HEART:  Regular rate rhythm, no murmurs, no rubs, no clicks Abd:  soft, positive bowel sounds, no organomegally, no rebound, no guarding Ext:  2 plus pulses, no edema, no cyanosis, no clubbing Skin:  No rashes no nodules Neuro:  CN II through XII intact, motor grossly intact  EKG - nsr with CHB  DEVICE  Normal device function.  See PaceArt for details. Less than a month from ERI  Assess/Plan:  CHB - she is asymptomatic s/p PPM insertion. She has no escape. I am concerned about her developing ERI and then reverting to VVI pacing at 65/min. She will feel poorly. I have recommended she proceed with PM generator change out. We will try to work around her chemotherapy for which she is scheduled in 2days.  PPM - she is less than a month from ERI.  H/o Staph infection - she is apparently free of any evidence of residual infection. We will place an anti-biotic pouch.  Chemotherapy - the patient is pending chemo on Thursday. She tells me that she will not get one of her chemotherapy drugs to help her heal after her PPM gen change. We will try to proceed with PM even if she is a few days from ERI so as not to have her endure VVI pacing in CHB.   [name] [name],MD

## 2022-08-20 NOTE — H&P (View-Only) (Signed)
HPI Ms. Meares returns today for followup. She is a pleasant 46 yo woman with probable congenital comlete heart block discovered 24 years ago, s/p PPM insertion. She developed stage 4 colon CA and has been on chronic chemotherapy. 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 She had a staph infection 3 years ago and was treated with prolonged anti-biotics. She has a rash/blisters with tape. She has been reciveing continuous chemotherapy but she has had some held in anticipation for her need for her PM generator change out. She denies fever/chills/night sweats.  Allergies  Allergen Reactions   Adhesive [Tape] Other (See Comments)    Blisters from steristrips   Chlorhexidine Rash    Causes blisters Causes blisters      Current Outpatient Medications  Medication Sig Dispense Refill   buPROPion (WELLBUTRIN XL) 300 MG 24 hr tablet Take 300 mg by mouth daily.     estradiol (ESTRACE) 1 MG tablet Take 1 tablet (1 mg total) by mouth daily. 30 tablet 12   gabapentin (NEURONTIN) 300 MG capsule Take 2 capsules by mouth 2 (two) times daily.   0   ibuprofen (ADVIL,MOTRIN) 600 MG tablet Take 1 tablet (600 mg total) by mouth every 6 (six) hours as needed. 30 tablet 0   LORazepam (ATIVAN) 1 MG tablet Take 1 mg by mouth daily.     valsartan-hydrochlorothiazide (DIOVAN-HCT) 160-25 MG tablet Take 1 tablet by mouth daily. 90 tablet 2   No current facility-administered medications for this visit.     Past Medical History:  Diagnosis Date   Anxiety    Arrhythmia    Hx congenital AV heart block - pt. has indwelling pacemaker   Hypertension    Presence of permanent cardiac pacemaker    Medtroni - was replaced in 2009 -complete heart block   Seasonal allergies    SVD (spontaneous vaginal delivery)    x 2    ROS:   All systems reviewed and negative except as noted in the HPI.   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;  Surgeon: Marylynn Pearson, MD;  Location: [facility];  Service: Gynecology;  Laterality: Left;  ok to schedule here per [place]     Family History  Problem Relation Age of Onset   Hypertension Mother    Cancer Mother 57       melanoma   Cancer Father 7       prostate   Cancer Maternal Grandmother 26       pancreatic   Cancer Paternal Grandfather 41       lymphoma     Social History   Socioeconomic History   Marital status: Married    Spouse name: Not on file   Number  of children: Not on file   Years of education: Not on file   Highest education level: Not on file  Occupational History   Not on file  Tobacco Use   Smoking status: Never   Smokeless tobacco: Never  Vaping Use   Vaping Use: Never used  Substance and Sexual Activity   Alcohol use: Yes    Comment: 2-or more glases daily wine   Drug use: No   Sexual activity: Yes    Birth control/protection: Surgical  Other Topics Concern   Not on file  Social History Narrative   Not on file   Social Determinants of Health   Financial Resource Strain: Not on file  Food Insecurity: Not on file  Transportation Needs: Not on file  Physical Activity: Not on file  Stress: Not on file  Social Connections: Not on file  Intimate Partner Violence: Not on file     BP 122/84   Ht 5' (1.524 m)   Wt 112 lb 6.4 oz (51 kg)   LMP  04/29/2014   SpO2 99%   BMI 21.95 kg/m   Physical Exam:  Well appearing 46 yo woman, NAD HEENT: Unremarkable Neck:  No JVD, no thyromegally Lymphatics:  No adenopathy Back:  No CVA tenderness Lungs:  Clear with no wheezes HEART:  Regular rate rhythm, no murmurs, no rubs, no clicks Abd:  soft, positive bowel sounds, no organomegally, no rebound, no guarding Ext:  2 plus pulses, no edema, no cyanosis, no clubbing Skin:  No rashes no nodules Neuro:  CN II through XII intact, motor grossly intact  EKG - nsr with CHB  DEVICE  Normal device function.  See PaceArt for details. Less than a month from ERI  Assess/Plan:  CHB - she is asymptomatic s/p PPM insertion. She has no escape. I am concerned about her developing ERI and then reverting to VVI pacing at 65/min. She will feel poorly. I have recommended she proceed with PM generator change out. We will try to work around her chemotherapy for which she is scheduled in 2days.  PPM - she is less than a month from ERI.  H/o Staph infection - she is apparently free of any evidence of residual infection. We will place an anti-biotic pouch.  Chemotherapy - the patient is pending chemo on Thursday. She tells me that she will not get one of her chemotherapy drugs to help her heal after her PPM gen change. We will try to proceed with PM even if she is a few days from ERI so as not to have her endure VVI pacing in CHB.   [name] Gennell How,MD

## 2022-08-20 NOTE — Patient Instructions (Addendum)
Medication Instructions:  Your physician recommends that you continue on your current medications as directed. Please refer to the Current Medication list given to you today.  *If you need a refill on your cardiac medications before your next appointment, please call your pharmacy*  Lab Work: PATIENT WILL HAVE LABS DRAWN, ( CBC AND BMP) WITH HER ONCOLOGIST ON 08/22/2022.     Testing/Procedures: None ordered.  Follow-Up:  SEE INSTRUCTION LETTER  Remote monitoring is used to monitor your Pacemaker from home. This monitoring reduces the number of office visits required to check your device to one time per year. It allows [place] to keep an eye on the functioning of your device to ensure it is working properly. You are scheduled for a device check from home on 10/03/22. You may send your transmission at any time that day. If you have a wireless device, the transmission will be sent automatically. After your physician reviews your transmission, you will receive a postcard with your next transmission date.  Pacemaker Battery Change, Care After This sheet gives you information about how to care for yourself after your procedure. Your health care provider may also give you more specific instructions. If you have problems or questions, contact your health care provider. What can I expect after the procedure? After the procedure, it is common to have these symptoms at the site where the pacemaker was inserted: Mild pain or soreness. Slight bruising. Some swelling over the incisions. A slight bump over the skin where the device was placed (if it was implanted in the upper chest area). Sometimes, it is possible to feel the device under the skin. This is normal. Follow these instructions at home: Incision care  Keep the incision clean and dry for 2-3 days after the procedure or as told by your health care provider. It takes several weeks for the incision site to completely heal. Do not remove the bandage  (dressing) on your chest until told to do so by your health care provider. Leave stitches (sutures), skin glue, or adhesive strips in place. These skin closures may need to stay in place for 2 weeks or longer. If adhesive strip edges start to loosen and curl up, you may trim the loose edges. Do not remove adhesive strips completely unless your health care provider tells you to do that. Do not take baths, swim, or use a hot tub for 7-10 days or until your health care provider approves. Ask your health care provider if you may take showers. You may only be allowed to take sponge baths. Pat the incision area dry with a clean towel. Do not rub the area. This may cause bleeding. Check your incision area every day for signs of infection. Check for: More redness, swelling, or pain. Fluid or blood. Warmth. Pus or a bad smell. Avoid putting pressure on the area where the pacemaker was placed. Women may want to place a small pad over the incision site to protect it from their bra strap. Medicines Take over-the-counter and prescription medicines only as told by your health care provider. If you were prescribed an antibiotic medicine, take it as told by your health care provider. Do not stop taking the antibiotic even if you start to feel better. Activity For the first 2 weeks, or as long as told by your health care provider: Avoid lifting your left arm higher than your shoulder. Be gentle when you move your arms over your head. It is okay to raise your arm to comb your hair.  Avoid exercise or activities that take a lot of effort. Ask your health care provider when it is okay to: Return to your normal activities. Return to work or school. Resume sexual activity. If you were given a medicine to help you relax (sedative) during the procedure, it can affect you for several hours. Do not drive or operate machinery until your health care provider says that it is safe. General instructions Do not use any  products that contain nicotine or tobacco, such as cigarettes, e-cigarettes, and chewing tobacco. These can delay incision healing after surgery. If you need help quitting, ask your health care provider. Always let all health care providers, including dentists, know about your pacemaker before you have any medical procedures or tests. You may be shown how to transfer data from your pacemaker through the phone to your health care provider. Wear a medical ID bracelet or necklace stating that you have a pacemaker, and carry a pacemaker ID card with you at all times. Avoid close and prolonged exposure to electrical devices that have strong magnetic fields. These include: Photographer. When at the airport, let officials know that you have a pacemaker. Carry your pacemaker ID card. Metal detectors. If you must pass through a metal detector, walk through it quickly. Do not stop under the detector or stand near it. When using your mobile phone, hold it to the ear opposite the pacemaker. Do not leave your mobile phone in a pocket over the pacemaker. Your pacemaker battery will last for 5-15 years. Your health care provider will do routine checks to know when the battery is starting to run down. When this happens, the pacemaker will need to be replaced. Keep all follow-up visits as told by your health care provider. This is important. Contact a health care provider if: You have pain at the incision site that is not relieved by medicines. You have any of these signs of infection: More redness, swelling, or pain around your incision. Fluid or blood coming from your incision. Warmth coming from your incision. Pus or a bad smell coming from your incision. A fever. You feel brief, occasional palpitations, light-headedness, or any symptoms that you think might be related to your heart. Get help right away if: You have chest pain that is different from the pain at the pacemaker site. You develop  a red streak that extends above or below the incision site. You have shortness of breath. You have palpitations or an irregular heartbeat. You have light-headedness that does not go away quickly. You faint or have dizzy spells. Your pulse suddenly drops or increases rapidly and does not return to normal. You gain weight and your legs and ankles swell. Summary After the procedure, it is common to have pain, soreness, and some swelling or bruising where the pacemaker was inserted. Keep your incision clean and dry. Follow instructions from your health care provider about how to take care of your incision. Check your incision every day for signs of infection, such as more pain or swelling, pus or a bad smell, warmth, or leaking fluid or blood. Carry a pacemaker ID card with you at all times. This information is not intended to replace advice given to you by your health care provider. Make sure you discuss any questions you have with your health care provider. Document Revised: 11/18/2019 Document Reviewed: 11/18/2019 Elsevier Patient Education  [place].

## 2022-08-21 ENCOUNTER — Telehealth: Payer: Self-pay

## 2022-08-21 NOTE — Telephone Encounter (Signed)
Received notification that gen change has been approved.  Outreach made to husband to notify.  No further action needed.

## 2022-08-21 NOTE — Telephone Encounter (Signed)
Discussed case with AH in precert.  Will start authorization process for urgent gen change d/t needed chemotherapy.  Advised husband about process.  Advised to send remote next Tuesday as discussed.  Will continue to follow.

## 2022-08-21 NOTE — Telephone Encounter (Signed)
Left voicemail message about changing Gen change procedure time on 09/06/22 from 930 am to 1130 am with Dr Lovena Le.  Need to move to new time regarding scheduling difficulty.  Follow up required.

## 2022-09-05 NOTE — Pre-Procedure Instructions (Signed)
Instructed patient on the following items: Arrival time 0630 Nothing to eat or drink after midnight No meds AM of procedure Responsible person to drive you home and stay with you for 24 hrs Wash with special soap night before and morning of procedure

## 2022-09-06 ENCOUNTER — Ambulatory Visit (HOSPITAL_COMMUNITY)
Admission: RE | Admit: 2022-09-06 | Discharge: 2022-09-06 | Disposition: A | Discharge status: Home / Self Care | Payer: 59 | Source: Ambulatory Visit | Attending: Internal Medicine | Admitting: Internal Medicine

## 2022-09-06 ENCOUNTER — Encounter (HOSPITAL_COMMUNITY)
Admission: RE | Disposition: A | Discharge status: Home / Self Care | Payer: Self-pay | Source: Ambulatory Visit | Attending: Internal Medicine

## 2022-09-06 ENCOUNTER — Other Ambulatory Visit: Payer: Self-pay

## 2022-09-06 DIAGNOSIS — Z9221 Personal history of antineoplastic chemotherapy: Secondary | ICD-10-CM | POA: Insufficient documentation

## 2022-09-06 DIAGNOSIS — I442 Atrioventricular block, complete: Secondary | ICD-10-CM | POA: Diagnosis not present

## 2022-09-06 DIAGNOSIS — Z950 Presence of cardiac pacemaker: Secondary | ICD-10-CM

## 2022-09-06 DIAGNOSIS — C189 Malignant neoplasm of colon, unspecified: Secondary | ICD-10-CM | POA: Insufficient documentation

## 2022-09-06 DIAGNOSIS — Z45010 Encounter for checking and testing of cardiac pacemaker pulse generator [battery]: Secondary | ICD-10-CM | POA: Insufficient documentation

## 2022-09-06 HISTORY — PX: PPM GENERATOR CHANGEOUT: EP1233

## 2022-09-06 LAB — CBC
HCT: 37.7 % (ref 36.0–46.0)
Hemoglobin: 13 g/dL (ref 12.0–15.0)
MCH: 33.7 pg (ref 26.0–34.0)
MCHC: 34.5 g/dL (ref 30.0–36.0)
MCV: 97.7 fL (ref 80.0–100.0)
Platelets: 218 10*3/uL (ref 150–400)
RBC: 3.86 MIL/uL — ABNORMAL LOW (ref 3.87–5.11)
RDW: 12.3 % (ref 11.5–15.5)
WBC: 7.7 10*3/uL (ref 4.0–10.5)
nRBC: 0 % (ref 0.0–0.2)

## 2022-09-06 LAB — BASIC METABOLIC PANEL
Anion gap: 9 (ref 5–15)
BUN: 11 mg/dL (ref 6–20)
CO2: 24 mmol/L (ref 22–32)
Calcium: 9.3 mg/dL (ref 8.9–10.3)
Chloride: 101 mmol/L (ref 98–111)
Creatinine, Ser: 0.86 mg/dL (ref 0.44–1.00)
GFR, Estimated: >60 mL/min (ref >60)
Glucose, Bld: 99 mg/dL (ref 70–99)
Potassium: 4.1 mmol/L (ref 3.5–5.1)
Sodium: 134 mmol/L — ABNORMAL LOW (ref 135–145)

## 2022-09-06 SURGERY — PPM GENERATOR CHANGEOUT

## 2022-09-06 MED ORDER — FENTANYL CITRATE (PF) 100 MCG/2ML IJ SOLN
INTRAMUSCULAR | Status: AC
  Filled 2022-09-06: qty 2

## 2022-09-06 MED ORDER — SODIUM CHLORIDE 0.9 % IV SOLN
INTRAVENOUS | Status: AC
  Filled 2022-09-06: qty 2

## 2022-09-06 MED ORDER — FENTANYL CITRATE (PF) 100 MCG/2ML IJ SOLN
INTRAMUSCULAR | Status: DC | PRN
  Administered 2022-09-06 (×2): 25 ug via INTRAVENOUS

## 2022-09-06 MED ORDER — POVIDONE-IODINE 10 % EX SWAB: 2.0000 | Freq: Once | CUTANEOUS | Status: DC

## 2022-09-06 MED ORDER — LIDOCAINE HCL (PF) 1 % IJ SOLN
INTRAMUSCULAR | Status: AC
  Filled 2022-09-06: qty 60

## 2022-09-06 MED ORDER — ACETAMINOPHEN 325 MG PO TABS: 325.0000 mg | ORAL_TABLET | ORAL | Status: DC | PRN

## 2022-09-06 MED ORDER — CEFAZOLIN SODIUM-DEXTROSE 2-4 GM/100ML-% IV SOLN
2.0000 g | INTRAVENOUS | Status: AC
  Administered 2022-09-06: 2 g via INTRAVENOUS

## 2022-09-06 MED ORDER — CEFAZOLIN SODIUM-DEXTROSE 2-4 GM/100ML-% IV SOLN
INTRAVENOUS | Status: AC
  Filled 2022-09-06: qty 100

## 2022-09-06 MED ORDER — MIDAZOLAM HCL 5 MG/5ML IJ SOLN
INTRAMUSCULAR | Status: AC
Start: 2022-09-06 — End: ?
  Filled 2022-09-06: qty 5

## 2022-09-06 MED ORDER — SODIUM CHLORIDE 0.9 % IV SOLN
80.0000 mg | INTRAVENOUS | Status: AC
  Administered 2022-09-06: 80 mg

## 2022-09-06 MED ORDER — MIDAZOLAM HCL 5 MG/5ML IJ SOLN
INTRAMUSCULAR | Status: DC | PRN
  Administered 2022-09-06 (×2): 2 mg via INTRAVENOUS

## 2022-09-06 MED ORDER — LIDOCAINE HCL (PF) 1 % IJ SOLN
INTRAMUSCULAR | Status: DC | PRN
  Administered 2022-09-06: 45 mL

## 2022-09-06 MED ORDER — SODIUM CHLORIDE 0.9 % IV SOLN: INTRAVENOUS | Status: DC

## 2022-09-06 MED ORDER — ONDANSETRON HCL 4 MG/2ML IJ SOLN: 4.0000 mg | Freq: Four times a day (QID) | INTRAMUSCULAR | Status: DC | PRN

## 2022-09-06 SURGICAL SUPPLY — 7 items
CABLE SURGICAL S-101-97-12 (CABLE) ×1 IMPLANT
IPG PACE AZUR XT DR MRI W1DR01 (Pacemaker) IMPLANT
PACE AZURE XT DR MRI W1DR01 (Pacemaker) ×1 IMPLANT
PAD DEFIB RADIO PHYSIO CONN (PAD) ×1 IMPLANT
POUCH AIGIS-R ANTIBACT PPM (Mesh General) ×1 IMPLANT
POUCH AIGIS-R ANTIBACT PPM MED (Mesh General) IMPLANT
TRAY PACEMAKER INSERTION (PACKS) ×1 IMPLANT

## 2022-09-06 NOTE — Interval H&P Note (Signed)
History and Physical Interval Note:  09/06/2022 11:00 AM  [name]  has presented today for surgery, with the diagnosis of eri.  The various methods of treatment have been discussed with the patient and family. After consideration of risks, benefits and other options for treatment, the patient has consented to  Procedure(s): PPM GENERATOR CHANGEOUT (N/A) as a surgical intervention.  The patient's history has been reviewed, patient examined, no change in status, stable for surgery.  I have reviewed the patient's chart and labs.  Questions were answered to the patient's satisfaction.     Cristopher Peru

## 2022-09-06 NOTE — Discharge Instructions (Signed)

## 2022-09-09 ENCOUNTER — Encounter (HOSPITAL_COMMUNITY): Payer: Self-pay | Admitting: Internal Medicine

## 2022-09-18 ENCOUNTER — Ambulatory Visit: Payer: 59 | Attending: Cardiology

## 2022-09-18 DIAGNOSIS — I442 Atrioventricular block, complete: Secondary | ICD-10-CM

## 2022-09-18 LAB — CUP PACEART INCLINIC DEVICE CHECK
Battery Remaining Longevity: 133 mo
Battery Voltage: 3.19 V
Brady Statistic AP VP Percent: 7.18 %
Brady Statistic AP VS Percent: 0 %
Brady Statistic AS VP Percent: 92.82 %
Brady Statistic AS VS Percent: 0 %
Brady Statistic RA Percent Paced: 7.17 %
Brady Statistic RV Percent Paced: 100 %
Date Time Interrogation Session: 20230920141600
Implantable Lead Implant Date: 19990617
Implantable Lead Implant Date: 19990617
Implantable Lead Location: 753859
Implantable Lead Location: 753860
Implantable Pulse Generator Implant Date: 20230908
Lead Channel Impedance Value: 323 Ohm
Lead Channel Impedance Value: 361 Ohm
Lead Channel Impedance Value: 551 Ohm
Lead Channel Impedance Value: 684 Ohm
Lead Channel Pacing Threshold Amplitude: 1.125 V
Lead Channel Pacing Threshold Amplitude: 1.75 V
Lead Channel Pacing Threshold Pulse Width: 0.4 ms
Lead Channel Pacing Threshold Pulse Width: 0.4 ms
Lead Channel Sensing Intrinsic Amplitude: 1.25 mV
Lead Channel Sensing Intrinsic Amplitude: 1.5 mV
Lead Channel Sensing Intrinsic Amplitude: 7.75 mV
Lead Channel Setting Pacing Amplitude: 2.75 V
Lead Channel Setting Pacing Amplitude: 2.75 V
Lead Channel Setting Pacing Pulse Width: 0.4 ms
Lead Channel Setting Sensing Sensitivity: 1.2 mV

## 2022-09-18 NOTE — Progress Notes (Signed)
Wound check appointment. Wound without redness or edema. Stitch noted at distal edge of incision site.  Assessed and removed by Dr. Lovena Le.  Advised to wash area twice a day and continue to monitor. Normal device function. Thresholds, sensing, and impedances consistent with previous measurements.  Histogram distribution appropriate for patient and level of activity. No mode switches or high ventricular rates noted. Patient educated about wound care. Advised to avoid pool for 2 additional weeks.  May shower.  ROV in 3 months with implanting physician.

## 2022-09-18 NOTE — Patient Instructions (Addendum)
After Your Pacemaker   Monitor your pacemaker site for redness, swelling, and drainage. Call the device clinic at [phone] if you experience these symptoms or fever/chills.  Your incision was closed with Steri-strips or staples:  You may shower 7 days after your procedure and wash your incision with soap and water. Avoid lotions, ointments, or perfumes over your incision until it is well-healed.  You may use a hot tub or a pool after your wound check appointment if the incision is completely closed.  PLEASE wait 2 more weeks before using the pool.  You may drive, unless driving has been restricted by your healthcare providers.   Remote monitoring is used to monitor your pacemaker from home. This monitoring is scheduled every 91 days by our office. It allows [place] to keep an eye on the functioning of your device to ensure it is working properly. You will routinely see your Electrophysiologist annually (more often if necessary).

## 2022-12-09 ENCOUNTER — Ambulatory Visit: Payer: Self-pay | Admitting: Internal Medicine

## 2022-12-09 ENCOUNTER — Ambulatory Visit (INDEPENDENT_AMBULATORY_CARE_PROVIDER_SITE_OTHER): Payer: 59

## 2022-12-09 DIAGNOSIS — I442 Atrioventricular block, complete: Secondary | ICD-10-CM

## 2022-12-10 LAB — CUP PACEART REMOTE DEVICE CHECK
Battery Remaining Longevity: 129 mo
Battery Voltage: 3.15 V
Brady Statistic AP VP Percent: 6.09 %
Brady Statistic AP VS Percent: 0 %
Brady Statistic AS VP Percent: 93.77 %
Brady Statistic AS VS Percent: 0.14 %
Brady Statistic RA Percent Paced: 6.09 %
Brady Statistic RV Percent Paced: 99.86 %
Date Time Interrogation Session: 20231210210428
Implantable Lead Connection Status: 753985
Implantable Lead Connection Status: 753985
Implantable Lead Implant Date: 19990617
Implantable Lead Implant Date: 19990617
Implantable Lead Location: 753859
Implantable Lead Location: 753860
Implantable Pulse Generator Implant Date: 20230908
Lead Channel Impedance Value: 380 Ohm
Lead Channel Impedance Value: 380 Ohm
Lead Channel Impedance Value: 551 Ohm
Lead Channel Impedance Value: 646 Ohm
Lead Channel Pacing Threshold Amplitude: 1.25 V
Lead Channel Pacing Threshold Amplitude: 1.625 V
Lead Channel Pacing Threshold Pulse Width: 0.4 ms
Lead Channel Pacing Threshold Pulse Width: 0.4 ms
Lead Channel Sensing Intrinsic Amplitude: 1.375 mV
Lead Channel Sensing Intrinsic Amplitude: 1.375 mV
Lead Channel Sensing Intrinsic Amplitude: 10.5 mV
Lead Channel Sensing Intrinsic Amplitude: 10.5 mV
Lead Channel Setting Pacing Amplitude: 2.75 V
Lead Channel Setting Pacing Amplitude: 2.75 V
Lead Channel Setting Pacing Pulse Width: 0.4 ms
Lead Channel Setting Sensing Sensitivity: 1.2 mV
Zone Setting Status: 755011

## 2023-01-13 NOTE — Progress Notes (Signed)
Remote pacemaker transmission.   

## 2023-02-07 ENCOUNTER — Other Ambulatory Visit: Payer: Self-pay | Admitting: Internal Medicine

## 2023-02-13 ENCOUNTER — Ambulatory Visit: Payer: Self-pay | Admitting: Internal Medicine

## 2023-03-10 ENCOUNTER — Ambulatory Visit: Payer: 59

## 2023-03-10 DIAGNOSIS — I442 Atrioventricular block, complete: Secondary | ICD-10-CM | POA: Diagnosis not present

## 2023-03-11 LAB — CUP PACEART REMOTE DEVICE CHECK
Battery Remaining Longevity: 132 mo
Battery Voltage: 3.08 V
Brady Statistic AP VP Percent: 10.2 %
Brady Statistic AP VS Percent: 0 %
Brady Statistic AS VP Percent: 89.71 %
Brady Statistic AS VS Percent: 0.09 %
Brady Statistic RA Percent Paced: 10.19 %
Brady Statistic RV Percent Paced: 99.9 %
Date Time Interrogation Session: 20240311062311
Implantable Lead Connection Status: 753985
Implantable Lead Connection Status: 753985
Implantable Lead Implant Date: 19990617
Implantable Lead Implant Date: 19990617
Implantable Lead Location: 753859
Implantable Lead Location: 753860
Implantable Pulse Generator Implant Date: 20230908
Lead Channel Impedance Value: 323 Ohm
Lead Channel Impedance Value: 323 Ohm
Lead Channel Impedance Value: 513 Ohm
Lead Channel Impedance Value: 608 Ohm
Lead Channel Pacing Threshold Amplitude: 1.125 V
Lead Channel Pacing Threshold Amplitude: 2.25 V
Lead Channel Pacing Threshold Pulse Width: 0.4 ms
Lead Channel Pacing Threshold Pulse Width: 0.4 ms
Lead Channel Sensing Intrinsic Amplitude: 1.375 mV
Lead Channel Sensing Intrinsic Amplitude: 1.375 mV
Lead Channel Sensing Intrinsic Amplitude: 9.875 mV
Lead Channel Sensing Intrinsic Amplitude: 9.875 mV
Lead Channel Setting Pacing Amplitude: 2.25 V
Lead Channel Setting Pacing Amplitude: 4 V
Lead Channel Setting Pacing Pulse Width: 0.4 ms
Lead Channel Setting Sensing Sensitivity: 1.2 mV
Zone Setting Status: 755011

## 2023-03-27 ENCOUNTER — Encounter: Payer: Self-pay | Admitting: Internal Medicine

## 2023-03-27 ENCOUNTER — Ambulatory Visit: Payer: 59 | Attending: Internal Medicine | Admitting: Internal Medicine

## 2023-03-27 VITALS — BP 110/78 | HR 79 | Ht 60.0 in | Wt 101.8 lb

## 2023-03-27 DIAGNOSIS — I442 Atrioventricular block, complete: Secondary | ICD-10-CM

## 2023-03-27 DIAGNOSIS — I10 Essential (primary) hypertension: Secondary | ICD-10-CM

## 2023-03-27 DIAGNOSIS — Z950 Presence of cardiac pacemaker: Secondary | ICD-10-CM

## 2023-03-27 LAB — CUP PACEART INCLINIC DEVICE CHECK
Battery Remaining Longevity: 136 mo
Battery Voltage: 3.07 V
Brady Statistic AP VP Percent: 6.41 %
Brady Statistic AP VS Percent: 0 %
Brady Statistic AS VP Percent: 93.49 %
Brady Statistic AS VS Percent: 0.1 %
Brady Statistic RA Percent Paced: 6.4 %
Brady Statistic RV Percent Paced: 99.9 %
Date Time Interrogation Session: 20240328192218
Implantable Lead Connection Status: 753985
Implantable Lead Connection Status: 753985
Implantable Lead Implant Date: 19990617
Implantable Lead Implant Date: 19990617
Implantable Lead Location: 753859
Implantable Lead Location: 753860
Implantable Pulse Generator Implant Date: 20230908
Lead Channel Impedance Value: 361 Ohm
Lead Channel Impedance Value: 418 Ohm
Lead Channel Impedance Value: 589 Ohm
Lead Channel Impedance Value: 703 Ohm
Lead Channel Pacing Threshold Amplitude: 1 V
Lead Channel Pacing Threshold Amplitude: 1.125 V
Lead Channel Pacing Threshold Amplitude: 1.875 V
Lead Channel Pacing Threshold Amplitude: 2 V
Lead Channel Pacing Threshold Pulse Width: 0.4 ms
Lead Channel Pacing Threshold Pulse Width: 0.4 ms
Lead Channel Pacing Threshold Pulse Width: 0.4 ms
Lead Channel Pacing Threshold Pulse Width: 0.4 ms
Lead Channel Sensing Intrinsic Amplitude: 1.25 mV
Lead Channel Sensing Intrinsic Amplitude: 4.125 mV
Lead Channel Sensing Intrinsic Amplitude: 6.875 mV
Lead Channel Sensing Intrinsic Amplitude: 7.75 mV
Lead Channel Setting Pacing Amplitude: 2.5 V
Lead Channel Setting Pacing Amplitude: 2.5 V
Lead Channel Setting Pacing Pulse Width: 0.4 ms
Lead Channel Setting Sensing Sensitivity: 1.2 mV
Zone Setting Status: 755011

## 2023-03-27 NOTE — Progress Notes (Signed)
HPI [name] returns today for followup. 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 She developed stage 4 colon CA and has been on chronic chemotherapy. 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 She had a staph infection 3 years ago and was treated with prolonged anti-biotics. She has a rash/blisters with tape. She has been reciveing chemotherapy thru a port in her left subclavian vein. She denies fever/chills/night sweats. No pain from her PM insertion site. Allergies  Allergen Reactions   Adhesive [Tape] Other (See Comments)    Blisters from steristrips   Chlorhexidine Rash    Causes blisters Sticky patch Dorma Russell       Current Outpatient Medications  Medication Sig Dispense Refill   buPROPion (WELLBUTRIN XL) 300 MG 24 hr tablet Take 300 mg by mouth daily.     doxycycline (MONODOX) 100 MG capsule Take 100 mg by mouth 2 (two) times daily.     estradiol (ESTRACE) 1 MG tablet Take 1 tablet (1 mg total) by mouth daily. 30 tablet 12   gabapentin (NEURONTIN) 300 MG capsule Take 300-600 mg by mouth See admin instructions. Take 300 mg in the morning and 600 mg at bedtime  0   ibuprofen (ADVIL,MOTRIN) 600 MG tablet Take 1 tablet (600 mg total) by mouth every 6 (six) hours as needed. (Patient taking differently: Take 400 mg by mouth every 6 (six) hours as needed for headache, moderate pain or mild pain.) 30 tablet 0   LORazepam (ATIVAN) 1 MG tablet Take 1 mg by mouth at bedtime.     Phenylephrine HCl (SINEX ULTRA FINE MIST REGULAR NA) Place 1 spray into both nostrils daily as needed (Congestion).     valsartan-hydrochlorothiazide (DIOVAN-HCT) 160-25 MG tablet TAKE ONE TABLET BY MOUTH DAILY 90 tablet 2   No current facility-administered medications for this visit.     Past Medical History:  Diagnosis Date   Anxiety    Arrhythmia    Hx congenital  AV heart block - pt. has indwelling pacemaker   Hypertension    Presence of permanent cardiac pacemaker    Medtroni - was replaced in 2009 -complete heart block   Seasonal allergies    SVD (spontaneous vaginal delivery)    x 2    ROS:   All systems reviewed and negative except as noted in the HPI.   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;  Surgeon: Marylynn Pearson, MD;  Location: [facility];  Service: Gynecology;  Laterality: Left;  ok to schedule here per [place] N/A 09/06/2022   Procedure: PPM GENERATOR CHANGEOUT;  Surgeon: Evans Lance, MD;  Location: [facility];  Service: Cardiovascular;  Laterality: N/A;   TUBAL LIGATION  WISDOM TOOTH EXTRACTION  1994     Family History  Problem Relation Age of Onset   Hypertension Mother    Cancer Mother 68       melanoma   Cancer Father 36       prostate   Cancer Maternal Grandmother 76       pancreatic   Cancer Paternal Grandfather 61       lymphoma     Social History   Socioeconomic History   Marital status: Married    Spouse name: Not on file   Number of children: Not on file   Years of education: Not on file   Highest education level: Not on file  Occupational History   Not on file  Tobacco Use   Smoking status: Never   Smokeless tobacco: Never  Vaping Use   Vaping Use: Never used  Substance and Sexual Activity   Alcohol  use: Yes    Comment: 2-or more glases daily wine   Drug use: No   Sexual activity: Yes    Birth control/protection: Surgical  Other Topics Concern   Not on file  Social History Narrative   Not on file   Social Determinants of Health   Financial Resource Strain: Not on file  Food Insecurity: Not on file  Transportation Needs: Not on file  Physical Activity: Not on file  Stress: Not on file  Social Connections: Not on file  Intimate Partner Violence: Not on file     BP 110/78   Pulse 79   Ht 5' (1.524 m)   Wt 101 lb 12.8 oz (46.2 kg)   LMP 04/29/2014   SpO2 99%   BMI 19.88 kg/m   Physical Exam:  Well appearing NAD HEENT: Unremarkable Neck:  No JVD, no thyromegally Lymphatics:  No adenopathy Back:  No CVA tenderness Lungs:  Clear with no wheezes HEART:  Regular rate rhythm, no murmurs, no rubs, no clicks Abd:  soft, positive bowel sounds, no organomegally, no rebound, no guarding Ext:  2 plus pulses, no edema, no cyanosis, no clubbing Skin:  No rashes no nodules Neuro:  CN II through XII intact, motor grossly intact  EKG - nsr with ventricular pacing  DEVICE  Normal device function.  See PaceArt for details.   Assess/Plan:  CHB - she is asymptomatic s/p PPM insertion. She has no escape. She is stable after DDD PPM gen change out. PPM - Her medtronic DDD PM is working normally and her pocket has healed nicely. H/o Staph infection - she is apparently free of any evidence of residual infection   [name]

## 2023-03-27 NOTE — Patient Instructions (Signed)
Medication Instructions:  Your physician recommends that you continue on your current medications as directed. Please refer to the Current Medication list given to you today.  *If you need a refill on your cardiac medications before your next appointment, please call your pharmacy*  Lab Work: None ordered.  If you have labs (blood work) drawn today and your tests are completely normal, you will receive your results only by: [place] (if you have MyChart) OR A paper copy in the mail If you have any lab test that is abnormal or we need to change your treatment, we will call you to review the results.  Testing/Procedures: None ordered.  Follow-Up: At [facility], you and your health needs are our priority.  As part of our continuing mission to provide you with exceptional heart care, we have created designated Provider Care Teams.  These Care Teams include your primary Cardiologist (physician) and Advanced Practice Providers (APPs -  Physician Assistants and Nurse Practitioners) who all work together to provide you with the care you need, when you need it.   Your next appointment:   1 year(s)  The format for your next appointment:   In Person  Provider:   Cristopher Peru, MD{or one of the following Advanced Practice Providers on your designated Care Team:   Tommye Standard, [place] Legrand Como "Jonni Sanger" Chalmers Cater, [place]  Remote monitoring is used to monitor your Pacemaker from home. This monitoring reduces the number of office visits required to check your device to one time per year. It allows [place] to keep an eye on the functioning of your device to ensure it is working properly. You are scheduled for a device check from home on 06/09/23. You may send your transmission at any time that day. If you have a wireless device, the transmission will be sent automatically. After your physician reviews your transmission, you will receive a postcard with your next transmission date.

## 2023-04-16 NOTE — Progress Notes (Signed)
Remote pacemaker transmission.   

## 2023-06-09 ENCOUNTER — Ambulatory Visit (INDEPENDENT_AMBULATORY_CARE_PROVIDER_SITE_OTHER): Payer: 59

## 2023-06-09 DIAGNOSIS — I442 Atrioventricular block, complete: Secondary | ICD-10-CM | POA: Diagnosis not present

## 2023-06-09 LAB — CUP PACEART REMOTE DEVICE CHECK
Battery Remaining Longevity: 128 mo
Battery Voltage: 3.04 V
Brady Statistic AP VP Percent: 13.52 %
Brady Statistic AP VS Percent: 0 %
Brady Statistic AS VP Percent: 86.46 %
Brady Statistic AS VS Percent: 0.02 %
Brady Statistic RA Percent Paced: 13.51 %
Brady Statistic RV Percent Paced: 99.98 %
Date Time Interrogation Session: 20240609225444
Implantable Lead Connection Status: 753985
Implantable Lead Connection Status: 753985
Implantable Lead Implant Date: 19990617
Implantable Lead Implant Date: 19990617
Implantable Lead Location: 753859
Implantable Lead Location: 753860
Implantable Pulse Generator Implant Date: 20230908
Lead Channel Impedance Value: 285 Ohm
Lead Channel Impedance Value: 323 Ohm
Lead Channel Impedance Value: 494 Ohm
Lead Channel Impedance Value: 608 Ohm
Lead Channel Pacing Threshold Amplitude: 1.125 V
Lead Channel Pacing Threshold Amplitude: 1.875 V
Lead Channel Pacing Threshold Pulse Width: 0.4 ms
Lead Channel Pacing Threshold Pulse Width: 0.4 ms
Lead Channel Sensing Intrinsic Amplitude: 1.375 mV
Lead Channel Sensing Intrinsic Amplitude: 1.375 mV
Lead Channel Sensing Intrinsic Amplitude: 6.25 mV
Lead Channel Sensing Intrinsic Amplitude: 6.25 mV
Lead Channel Setting Pacing Amplitude: 2.5 V
Lead Channel Setting Pacing Amplitude: 2.5 V
Lead Channel Setting Pacing Pulse Width: 0.4 ms
Lead Channel Setting Sensing Sensitivity: 1.2 mV
Zone Setting Status: 755011

## 2023-06-17 NOTE — Telephone Encounter (Signed)
See message.

## 2023-06-30 NOTE — Progress Notes (Signed)
Remote pacemaker transmission.   

## 2023-09-08 ENCOUNTER — Ambulatory Visit (INDEPENDENT_AMBULATORY_CARE_PROVIDER_SITE_OTHER): Payer: 59

## 2023-09-08 DIAGNOSIS — I442 Atrioventricular block, complete: Secondary | ICD-10-CM | POA: Diagnosis not present

## 2023-09-08 LAB — CUP PACEART REMOTE DEVICE CHECK
Battery Remaining Longevity: 123 mo
Battery Voltage: 3.03 V
Brady Statistic AP VP Percent: 10.8 %
Brady Statistic AP VS Percent: 0 %
Brady Statistic AS VP Percent: 89.19 %
Brady Statistic AS VS Percent: 0 %
Brady Statistic RA Percent Paced: 10.8 %
Brady Statistic RV Percent Paced: 100 %
Date Time Interrogation Session: 20240908211011
Implantable Lead Connection Status: 753985
Implantable Lead Connection Status: 753985
Implantable Lead Implant Date: 19990617
Implantable Lead Implant Date: 19990617
Implantable Lead Location: 753859
Implantable Lead Location: 753860
Implantable Pulse Generator Implant Date: 20230908
Lead Channel Impedance Value: 266 Ohm
Lead Channel Impedance Value: 323 Ohm
Lead Channel Impedance Value: 456 Ohm
Lead Channel Impedance Value: 570 Ohm
Lead Channel Pacing Threshold Amplitude: 1.125 V
Lead Channel Pacing Threshold Amplitude: 1.875 V
Lead Channel Pacing Threshold Pulse Width: 0.4 ms
Lead Channel Pacing Threshold Pulse Width: 0.4 ms
Lead Channel Sensing Intrinsic Amplitude: 0.875 mV
Lead Channel Sensing Intrinsic Amplitude: 0.875 mV
Lead Channel Sensing Intrinsic Amplitude: 6.5 mV
Lead Channel Sensing Intrinsic Amplitude: 6.5 mV
Lead Channel Setting Pacing Amplitude: 2.5 V
Lead Channel Setting Pacing Amplitude: 2.5 V
Lead Channel Setting Pacing Pulse Width: 0.4 ms
Lead Channel Setting Sensing Sensitivity: 1.2 mV
Zone Setting Status: 755011

## 2023-09-18 NOTE — Progress Notes (Signed)
Remote pacemaker transmission.   

## 2023-12-08 ENCOUNTER — Ambulatory Visit (INDEPENDENT_AMBULATORY_CARE_PROVIDER_SITE_OTHER): Payer: BC Managed Care – PPO

## 2023-12-08 DIAGNOSIS — I442 Atrioventricular block, complete: Secondary | ICD-10-CM | POA: Diagnosis not present

## 2023-12-08 LAB — CUP PACEART REMOTE DEVICE CHECK
Battery Remaining Longevity: 122 mo
Battery Voltage: 3.02 V
Brady Statistic AP VP Percent: 2.51 %
Brady Statistic AP VS Percent: 0 %
Brady Statistic AS VP Percent: 97.49 %
Brady Statistic AS VS Percent: 0 %
Brady Statistic RA Percent Paced: 2.51 %
Brady Statistic RV Percent Paced: 100 %
Date Time Interrogation Session: 20241208231408
Implantable Lead Connection Status: 753985
Implantable Lead Connection Status: 753985
Implantable Lead Implant Date: 19990617
Implantable Lead Implant Date: 19990617
Implantable Lead Location: 753859
Implantable Lead Location: 753860
Implantable Pulse Generator Implant Date: 20230908
Lead Channel Impedance Value: 304 Ohm
Lead Channel Impedance Value: 323 Ohm
Lead Channel Impedance Value: 494 Ohm
Lead Channel Impedance Value: 589 Ohm
Lead Channel Pacing Threshold Amplitude: 1.125 V
Lead Channel Pacing Threshold Amplitude: 1.875 V
Lead Channel Pacing Threshold Pulse Width: 0.4 ms
Lead Channel Pacing Threshold Pulse Width: 0.4 ms
Lead Channel Sensing Intrinsic Amplitude: 1 mV
Lead Channel Sensing Intrinsic Amplitude: 1 mV
Lead Channel Sensing Intrinsic Amplitude: 8.5 mV
Lead Channel Sensing Intrinsic Amplitude: 8.5 mV
Lead Channel Setting Pacing Amplitude: 2.5 V
Lead Channel Setting Pacing Amplitude: 2.5 V
Lead Channel Setting Pacing Pulse Width: 0.4 ms
Lead Channel Setting Sensing Sensitivity: 1.2 mV
Zone Setting Status: 755011

## 2024-03-08 ENCOUNTER — Ambulatory Visit (INDEPENDENT_AMBULATORY_CARE_PROVIDER_SITE_OTHER): Payer: Self-pay

## 2024-03-08 DIAGNOSIS — I442 Atrioventricular block, complete: Secondary | ICD-10-CM | POA: Diagnosis not present

## 2024-03-09 ENCOUNTER — Encounter: Payer: Self-pay | Admitting: Internal Medicine

## 2024-03-09 LAB — CUP PACEART REMOTE DEVICE CHECK
Battery Remaining Longevity: 123 mo
Battery Voltage: 3.02 V
Brady Statistic AP VP Percent: 5.85 %
Brady Statistic AP VS Percent: 0 %
Brady Statistic AS VP Percent: 94.14 %
Brady Statistic AS VS Percent: 0 %
Brady Statistic RA Percent Paced: 5.85 %
Brady Statistic RV Percent Paced: 100 %
Date Time Interrogation Session: 20250309225007
Implantable Lead Connection Status: 753985
Implantable Lead Connection Status: 753985
Implantable Lead Implant Date: 19990617
Implantable Lead Implant Date: 19990617
Implantable Lead Location: 753859
Implantable Lead Location: 753860
Implantable Pulse Generator Implant Date: 20230908
Lead Channel Impedance Value: 380 Ohm
Lead Channel Impedance Value: 380 Ohm
Lead Channel Impedance Value: 608 Ohm
Lead Channel Impedance Value: 684 Ohm
Lead Channel Pacing Threshold Amplitude: 1.125 V
Lead Channel Pacing Threshold Amplitude: 1.875 V
Lead Channel Pacing Threshold Pulse Width: 0.4 ms
Lead Channel Pacing Threshold Pulse Width: 0.4 ms
Lead Channel Sensing Intrinsic Amplitude: 1.25 mV
Lead Channel Sensing Intrinsic Amplitude: 1.25 mV
Lead Channel Sensing Intrinsic Amplitude: 9.375 mV
Lead Channel Sensing Intrinsic Amplitude: 9.375 mV
Lead Channel Setting Pacing Amplitude: 2.5 V
Lead Channel Setting Pacing Amplitude: 2.5 V
Lead Channel Setting Pacing Pulse Width: 0.4 ms
Lead Channel Setting Sensing Sensitivity: 1.2 mV
Zone Setting Status: 755011

## 2024-04-08 NOTE — Addendum Note (Signed)
 Addended by: Geralyn Flash D on: 04/08/2024 03:27 PM   Modules accepted: Orders

## 2024-04-08 NOTE — Progress Notes (Signed)
 Remote pacemaker transmission.

## 2024-06-07 ENCOUNTER — Ambulatory Visit (INDEPENDENT_AMBULATORY_CARE_PROVIDER_SITE_OTHER): Payer: Self-pay

## 2024-06-07 DIAGNOSIS — I442 Atrioventricular block, complete: Secondary | ICD-10-CM

## 2024-06-09 LAB — CUP PACEART REMOTE DEVICE CHECK
Battery Remaining Longevity: 119 mo
Battery Voltage: 3.01 V
Brady Statistic AP VP Percent: 5.04 %
Brady Statistic AP VS Percent: 0 %
Brady Statistic AS VP Percent: 94.96 %
Brady Statistic AS VS Percent: 0 %
Brady Statistic RA Percent Paced: 5.04 %
Brady Statistic RV Percent Paced: 100 %
Date Time Interrogation Session: 20250610150419
Implantable Lead Connection Status: 753985
Implantable Lead Connection Status: 753985
Implantable Lead Implant Date: 19990617
Implantable Lead Implant Date: 19990617
Implantable Lead Location: 753859
Implantable Lead Location: 753860
Implantable Pulse Generator Implant Date: 20230908
Lead Channel Impedance Value: 323 Ohm
Lead Channel Impedance Value: 342 Ohm
Lead Channel Impedance Value: 570 Ohm
Lead Channel Impedance Value: 665 Ohm
Lead Channel Pacing Threshold Amplitude: 1.125 V
Lead Channel Pacing Threshold Amplitude: 1.875 V
Lead Channel Pacing Threshold Pulse Width: 0.4 ms
Lead Channel Pacing Threshold Pulse Width: 0.4 ms
Lead Channel Sensing Intrinsic Amplitude: 1.125 mV
Lead Channel Sensing Intrinsic Amplitude: 1.125 mV
Lead Channel Sensing Intrinsic Amplitude: 6.625 mV
Lead Channel Sensing Intrinsic Amplitude: 6.625 mV
Lead Channel Setting Pacing Amplitude: 2.5 V
Lead Channel Setting Pacing Amplitude: 2.5 V
Lead Channel Setting Pacing Pulse Width: 0.4 ms
Lead Channel Setting Sensing Sensitivity: 1.2 mV
Zone Setting Status: 755011

## 2024-06-13 ENCOUNTER — Ambulatory Visit: Payer: Self-pay | Admitting: Internal Medicine

## 2024-07-20 NOTE — Addendum Note (Signed)
 Addended by: TAWNI DRILLING D on: 07/20/2024 04:24 PM   Modules accepted: Orders

## 2024-07-20 NOTE — Progress Notes (Signed)
 Remote pacemaker transmission.

## 2024-09-06 ENCOUNTER — Ambulatory Visit (INDEPENDENT_AMBULATORY_CARE_PROVIDER_SITE_OTHER): Payer: Self-pay

## 2024-09-06 DIAGNOSIS — I442 Atrioventricular block, complete: Secondary | ICD-10-CM

## 2024-09-07 LAB — CUP PACEART REMOTE DEVICE CHECK
Battery Remaining Longevity: 114 mo
Battery Voltage: 3.01 V
Brady Statistic AP VP Percent: 3.87 %
Brady Statistic AP VS Percent: 0 %
Brady Statistic AS VP Percent: 96.12 %
Brady Statistic AS VS Percent: 0.01 %
Brady Statistic RA Percent Paced: 3.87 %
Brady Statistic RV Percent Paced: 99.99 %
Date Time Interrogation Session: 20250907212741
Implantable Lead Connection Status: 753985
Implantable Lead Connection Status: 753985
Implantable Lead Implant Date: 19990617
Implantable Lead Implant Date: 19990617
Implantable Lead Location: 753859
Implantable Lead Location: 753860
Implantable Pulse Generator Implant Date: 20230908
Lead Channel Impedance Value: 304 Ohm
Lead Channel Impedance Value: 323 Ohm
Lead Channel Impedance Value: 513 Ohm
Lead Channel Impedance Value: 608 Ohm
Lead Channel Pacing Threshold Amplitude: 1.125 V
Lead Channel Pacing Threshold Amplitude: 1.875 V
Lead Channel Pacing Threshold Pulse Width: 0.4 ms
Lead Channel Pacing Threshold Pulse Width: 0.4 ms
Lead Channel Sensing Intrinsic Amplitude: 1.375 mV
Lead Channel Sensing Intrinsic Amplitude: 1.375 mV
Lead Channel Sensing Intrinsic Amplitude: 7.625 mV
Lead Channel Sensing Intrinsic Amplitude: 7.625 mV
Lead Channel Setting Pacing Amplitude: 2.5 V
Lead Channel Setting Pacing Amplitude: 2.5 V
Lead Channel Setting Pacing Pulse Width: 0.4 ms
Lead Channel Setting Sensing Sensitivity: 1.2 mV
Zone Setting Status: 755011

## 2024-09-14 ENCOUNTER — Ambulatory Visit: Payer: Self-pay | Admitting: Internal Medicine

## 2024-09-16 NOTE — Progress Notes (Signed)
 Remote PPM Transmission

## 2024-12-06 ENCOUNTER — Ambulatory Visit: Payer: Self-pay

## 2024-12-07 LAB — CUP PACEART REMOTE DEVICE CHECK
Battery Remaining Longevity: 112 mo
Battery Voltage: 3.01 V
Brady Statistic AP VP Percent: 0.32 %
Brady Statistic AP VS Percent: 0 %
Brady Statistic AS VP Percent: 99.68 %
Brady Statistic AS VS Percent: 0 %
Brady Statistic RA Percent Paced: 0.32 %
Brady Statistic RV Percent Paced: 100 %
Date Time Interrogation Session: 20251207234016
Implantable Lead Connection Status: 753985
Implantable Lead Connection Status: 753985
Implantable Lead Implant Date: 19990617
Implantable Lead Implant Date: 19990617
Implantable Lead Location: 753859
Implantable Lead Location: 753860
Implantable Pulse Generator Implant Date: 20230908
Lead Channel Impedance Value: 323 Ohm
Lead Channel Impedance Value: 342 Ohm
Lead Channel Impedance Value: 513 Ohm
Lead Channel Impedance Value: 627 Ohm
Lead Channel Pacing Threshold Amplitude: 1.125 V
Lead Channel Pacing Threshold Amplitude: 1.875 V
Lead Channel Pacing Threshold Pulse Width: 0.4 ms
Lead Channel Pacing Threshold Pulse Width: 0.4 ms
Lead Channel Sensing Intrinsic Amplitude: 0.875 mV
Lead Channel Sensing Intrinsic Amplitude: 0.875 mV
Lead Channel Sensing Intrinsic Amplitude: 8.375 mV
Lead Channel Sensing Intrinsic Amplitude: 8.375 mV
Lead Channel Setting Pacing Amplitude: 2.5 V
Lead Channel Setting Pacing Amplitude: 2.5 V
Lead Channel Setting Pacing Pulse Width: 0.4 ms
Lead Channel Setting Sensing Sensitivity: 1.2 mV
Zone Setting Status: 755011

## 2024-12-08 ENCOUNTER — Ambulatory Visit: Payer: Self-pay | Admitting: Internal Medicine

## 2024-12-14 NOTE — Progress Notes (Signed)
 Remote PPM Transmission

## 2025-03-07 ENCOUNTER — Ambulatory Visit: Payer: Self-pay

## 2025-06-06 ENCOUNTER — Ambulatory Visit: Payer: Self-pay

## 2025-09-06 ENCOUNTER — Ambulatory Visit: Payer: Self-pay

## 2025-12-06 ENCOUNTER — Ambulatory Visit: Payer: Self-pay
